# Patient Record
Sex: Female | Born: 1935
Health system: Southern US, Community
[De-identification: ages and names within clinical notes are randomized; demographics above are authoritative.]

## PROBLEM LIST (undated history)

## (undated) DIAGNOSIS — K219 Gastro-esophageal reflux disease without esophagitis: Secondary | ICD-10-CM

## (undated) DIAGNOSIS — I499 Cardiac arrhythmia, unspecified: Secondary | ICD-10-CM

## (undated) DIAGNOSIS — T7840XA Allergy, unspecified, initial encounter: Secondary | ICD-10-CM

## (undated) DIAGNOSIS — K589 Irritable bowel syndrome without diarrhea: Secondary | ICD-10-CM

## (undated) DIAGNOSIS — I10 Essential (primary) hypertension: Secondary | ICD-10-CM

## (undated) DIAGNOSIS — I00 Rheumatic fever without heart involvement: Secondary | ICD-10-CM

## (undated) DIAGNOSIS — E785 Hyperlipidemia, unspecified: Secondary | ICD-10-CM

## (undated) DIAGNOSIS — D4989 Neoplasm of unspecified behavior of other specified sites: Secondary | ICD-10-CM

## (undated) DIAGNOSIS — M1990 Unspecified osteoarthritis, unspecified site: Secondary | ICD-10-CM

## (undated) HISTORY — DX: Cardiac arrhythmia, unspecified: I49.9

## (undated) HISTORY — PX: TONSILLECTOMY: SUR1361

## (undated) HISTORY — PX: BOWEL RESECTION: SHX1257

## (undated) HISTORY — DX: Unspecified osteoarthritis, unspecified site: M19.90

## (undated) HISTORY — PX: CHOLECYSTECTOMY: SHX55

## (undated) HISTORY — PX: APPENDECTOMY: SHX54

## (undated) HISTORY — DX: Rheumatic fever without heart involvement: I00

## (undated) HISTORY — DX: Neoplasm of unspecified behavior of other specified sites: D49.89

## (undated) HISTORY — PX: HEMORROIDECTOMY: SUR656

## (undated) HISTORY — DX: Gastro-esophageal reflux disease without esophagitis: K21.9

## (undated) HISTORY — DX: Essential (primary) hypertension: I10

## (undated) HISTORY — DX: Irritable bowel syndrome, unspecified: K58.9

## (undated) HISTORY — DX: Hyperlipidemia, unspecified: E78.5

## (undated) HISTORY — PX: CATARACT EXTRACTION: SUR2

## (undated) HISTORY — PX: ROTATOR CUFF REPAIR: SHX139

## (undated) HISTORY — PX: ABDOMINAL HYSTERECTOMY: SHX81

## (undated) HISTORY — DX: Allergy, unspecified, initial encounter: T78.40XA

---

## 2003-12-22 ENCOUNTER — Encounter: Admission: RE | Admit: 2003-12-22 | Discharge: 2003-12-22 | Payer: Self-pay | Admitting: Family Medicine

## 2004-01-03 ENCOUNTER — Encounter: Admission: RE | Admit: 2004-01-03 | Discharge: 2004-01-03 | Payer: Self-pay | Admitting: Family Medicine

## 2004-01-11 ENCOUNTER — Encounter: Admission: RE | Admit: 2004-01-11 | Discharge: 2004-02-16 | Payer: Self-pay | Admitting: Sports Medicine

## 2005-11-05 ENCOUNTER — Ambulatory Visit: Payer: Self-pay | Admitting: Family Medicine

## 2005-11-08 ENCOUNTER — Ambulatory Visit: Payer: Self-pay | Admitting: Family Medicine

## 2005-12-03 ENCOUNTER — Ambulatory Visit: Payer: Self-pay | Admitting: Family Medicine

## 2006-01-10 ENCOUNTER — Ambulatory Visit: Payer: Self-pay | Admitting: Family Medicine

## 2006-04-18 ENCOUNTER — Ambulatory Visit: Payer: Self-pay | Admitting: Internal Medicine

## 2006-06-15 DIAGNOSIS — R7989 Other specified abnormal findings of blood chemistry: Secondary | ICD-10-CM | POA: Insufficient documentation

## 2006-12-08 ENCOUNTER — Telehealth (INDEPENDENT_AMBULATORY_CARE_PROVIDER_SITE_OTHER): Payer: Self-pay | Admitting: Family Medicine

## 2007-01-09 ENCOUNTER — Ambulatory Visit: Payer: Self-pay | Admitting: Family Medicine

## 2007-01-16 ENCOUNTER — Ambulatory Visit: Payer: Self-pay | Admitting: Family Medicine

## 2007-01-16 DIAGNOSIS — I8390 Asymptomatic varicose veins of unspecified lower extremity: Secondary | ICD-10-CM | POA: Insufficient documentation

## 2007-01-27 ENCOUNTER — Ambulatory Visit: Payer: Self-pay | Admitting: Family Medicine

## 2007-01-28 ENCOUNTER — Telehealth (INDEPENDENT_AMBULATORY_CARE_PROVIDER_SITE_OTHER): Payer: Self-pay | Admitting: *Deleted

## 2007-01-28 ENCOUNTER — Encounter (INDEPENDENT_AMBULATORY_CARE_PROVIDER_SITE_OTHER): Payer: Self-pay | Admitting: Family Medicine

## 2007-01-28 LAB — CONVERTED CEMR LAB: Hgb A1c MFr Bld: 5.4 % (ref 4.6–6.0)

## 2007-07-08 ENCOUNTER — Telehealth (INDEPENDENT_AMBULATORY_CARE_PROVIDER_SITE_OTHER): Payer: Self-pay | Admitting: *Deleted

## 2008-04-05 ENCOUNTER — Encounter (INDEPENDENT_AMBULATORY_CARE_PROVIDER_SITE_OTHER): Payer: Self-pay | Admitting: *Deleted

## 2008-04-05 ENCOUNTER — Encounter: Payer: Self-pay | Admitting: Family Medicine

## 2008-04-05 LAB — CONVERTED CEMR LAB
AST: 27 units/L
BUN: 12 mg/dL
Calcium: 9.9 mg/dL
Free T4: 0.97 ng/dL
HDL: 98 mg/dL
Magnesium: 1.8 mg/dL
TSH: 3.21 microintl units/mL
Triglycerides: 101 mg/dL

## 2008-10-05 ENCOUNTER — Encounter (INDEPENDENT_AMBULATORY_CARE_PROVIDER_SITE_OTHER): Payer: Self-pay | Admitting: *Deleted

## 2008-10-05 ENCOUNTER — Ambulatory Visit: Payer: Self-pay | Admitting: Family Medicine

## 2008-10-05 DIAGNOSIS — R109 Unspecified abdominal pain: Secondary | ICD-10-CM | POA: Insufficient documentation

## 2008-10-05 DIAGNOSIS — R519 Headache, unspecified: Secondary | ICD-10-CM | POA: Insufficient documentation

## 2008-10-05 DIAGNOSIS — E782 Mixed hyperlipidemia: Secondary | ICD-10-CM | POA: Insufficient documentation

## 2008-10-05 DIAGNOSIS — R51 Headache: Secondary | ICD-10-CM | POA: Insufficient documentation

## 2008-10-05 DIAGNOSIS — I10 Essential (primary) hypertension: Secondary | ICD-10-CM | POA: Insufficient documentation

## 2008-11-04 ENCOUNTER — Ambulatory Visit: Payer: Self-pay | Admitting: Internal Medicine

## 2008-11-04 DIAGNOSIS — K219 Gastro-esophageal reflux disease without esophagitis: Secondary | ICD-10-CM

## 2008-11-04 DIAGNOSIS — K5730 Diverticulosis of large intestine without perforation or abscess without bleeding: Secondary | ICD-10-CM | POA: Insufficient documentation

## 2008-12-08 ENCOUNTER — Ambulatory Visit: Payer: Self-pay | Admitting: Internal Medicine

## 2008-12-22 ENCOUNTER — Ambulatory Visit: Payer: Self-pay | Admitting: Internal Medicine

## 2010-09-14 NOTE — Assessment & Plan Note (Signed)
[facility]                          [place] OFFICE NOTE   INDYA, OLIVERIA                       MRN:          [id number]  DATE:01/10/2006                            DOB:          Aug 04, 1933    REASON FOR VISIT:  Muscle/body aches.   Ms. Zurita is a 77 year old female who is extremely active on a daily basis.  She reports that over the last 4-5 days she has been having discomfort in  her mid back on the right side.  It is pretty constant but it is exacerbated  with certain movement.  She describes similar discomfort in her right  lateral neck and upper arm.  She describes it as a muscle soreness.  She  continues to exercise regularly even though she has these symptoms.  She was  uncomfortable overnight and took some Tylenol.  She denies any other  symptoms, i.e., chest pain, shortness of breath, nausea, vomiting, fevers,  chills, cough, congestion.  Additionally, she denies any urinary symptoms.   MEDICATIONS:  1. Multivitamin.  2. Fish oil capsules.  3. Glucosamine.   OBJECTIVE:  VITAL SIGNS:  Blood pressure 120/86.  Weight 152.8.  Temperature  98.0.  Pulse 64.  GENERAL:  We have a pleasant female in no acute distress.  Answers questions  appropriately.  BACK:  Examination of the back significant for no midline tenderness.  Pain  is located on the paraspinal muscles on the right lower thoracic area.  No  obvious deformity was noted on palpation or on visual examination.  RIGHT SHOULDER:  Examination of the right shoulder significant for full  range of motion.  Strength is 5/5.  Cannot precipitate discomfort but the  patient was pain-free through the assessment of the right arm.  NEUROLOGIC:  Nonfocal.  ABDOMEN:  Soft, nondistended.  No palpable masses.  No hepatosplenomegaly.  No rebound or guarding.  UA is negative.   IMPRESSION:  This is a 77 year old female presenting with symptoms  consistent with musculoskeletal  strain.   PLAN:  1. Encouraged the patient to decrease exercise over the next 7-10 days.      Recommended daily stretching 2-3 times a day and walking daily as a      substitute for her exercise for the next 7-10 days.  2. Initially was going to prescribe over-the-counter Tylenol but the      patient did not find it very effective last night and therefore was      prescribed Naprosyn 500 mg b.i.d. for 10 days.  The patient is aware of      adverse GI effect as well as cardiovascular effects.  3. Recommended she take Prilosec OTC daily while on Naprosyn.  4. Advised the patient to follow up if symptoms worsen or do not improve      over the next 7-10 days.                                   Leanne Chang, M.D.   LA/MedQ  DD:  01/10/2006  DT:  01/11/2006  Job #:  [id number]

## 2011-01-16 ENCOUNTER — Ambulatory Visit (INDEPENDENT_AMBULATORY_CARE_PROVIDER_SITE_OTHER): Payer: Medicare Other | Admitting: Family Medicine

## 2011-01-16 ENCOUNTER — Encounter: Payer: Self-pay | Admitting: Family Medicine

## 2011-01-16 DIAGNOSIS — T7840XA Allergy, unspecified, initial encounter: Secondary | ICD-10-CM | POA: Insufficient documentation

## 2011-01-16 NOTE — Progress Notes (Signed)
  Subjective:    Patient ID: Jennifer Boone, female    DOB: [birth year], 77 y.o.   MRN: [id number]  HPI Allergic rxn- had a reaction on face and neck yesterday, 'was scarlet'.  Took benadryl yesterday w/ good results.  Pt denies changing soaps, lotions, detergents, makeup.  Does use towels at gym to wipe sweat from face.   Review of Systems For ROS see HPI     Objective:   Physical Exam  Vitals reviewed. Constitutional: She appears well-developed and well-nourished.  HENT:  Head: Normocephalic and atraumatic.  Skin: Skin is warm and dry. No rash noted. No erythema.          Assessment & Plan:

## 2011-01-16 NOTE — Patient Instructions (Signed)
Your reaction may have been due to the gym towels You look great today! Call with any questions or concerns Have a safe trip back to [place]

## 2011-01-16 NOTE — Assessment & Plan Note (Signed)
Pt's reaction has completely resolved.  No need for oral or topical meds.  Pt to ask her gym if detergent was changed.  Reviewed supportive care and red flags that should prompt return.  Pt expressed understanding and is in agreement w/ plan.

## 2011-01-24 ENCOUNTER — Ambulatory Visit (INDEPENDENT_AMBULATORY_CARE_PROVIDER_SITE_OTHER): Payer: Medicare Other | Admitting: Family Medicine

## 2011-01-24 ENCOUNTER — Encounter: Payer: Self-pay | Admitting: Family Medicine

## 2011-01-24 VITALS — BP 124/84 | Temp 98.7°F | Wt 158.0 lb

## 2011-01-24 DIAGNOSIS — T7840XA Allergy, unspecified, initial encounter: Secondary | ICD-10-CM

## 2011-01-24 MED ORDER — PREDNISONE 20 MG PO TABS: ORAL_TABLET | ORAL | Status: DC

## 2011-01-24 MED ORDER — METHYLPREDNISOLONE ACETATE 80 MG/ML IJ SUSP
80.0000 mg | Freq: Once | INTRAMUSCULAR | Status: AC
  Administered 2011-01-24: 80 mg via INTRAMUSCULAR

## 2011-01-24 NOTE — Patient Instructions (Signed)
This is due to your seasonal allergies Start Zyrtec daily Take the Prednisone starting tomorrow- take w/ food to avoid upset stomach Call with any questions or concerns Hang in there!

## 2011-01-24 NOTE — Progress Notes (Signed)
  Subjective:    Patient ID: Jennifer Boone, female    DOB: [birth year], 77 y.o.   MRN: [id number]  HPI Allergies- reports she again broke out on face, neck.  Having itchy scalp.  This morning had puffy eyes.  Took Benadryl yesterday which made her sleepy.  Took Entex w/ similar results.  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  Husband reports this is a seasonal rxn that occurs 'nearly every spring and fall'.  Pt feels it's worse in fall.  Not taking any allergy meds.   Review of Systems For ROS see HPI     Objective:   Physical Exam  Vitals reviewed. Constitutional: She appears well-developed and well-nourished. No distress.  HENT:       No swelling of lips, tongue, pharynx  Skin: Skin is warm and dry. There is erythema (redness and swelling of face, neck, and hairline).          Assessment & Plan:

## 2011-01-24 NOTE — Assessment & Plan Note (Signed)
Pt having seasonal allergic response to environmental exposure.  Reviewed importance of daily antihistamine use to prevent such a reaction.  Given facial involvement will give depo-medrol in office and start steroid burst tomorrow.  Reviewed supportive care and red flags that should prompt return.  Pt expressed understanding and is in agreement w/ plan.

## 2011-02-04 ENCOUNTER — Encounter: Payer: Self-pay | Admitting: Family Medicine

## 2011-02-04 ENCOUNTER — Ambulatory Visit (INDEPENDENT_AMBULATORY_CARE_PROVIDER_SITE_OTHER): Payer: Medicare Other | Admitting: Family Medicine

## 2011-02-04 VITALS — BP 132/80 | Temp 97.7°F | Wt 160.0 lb

## 2011-02-04 DIAGNOSIS — T7840XA Allergy, unspecified, initial encounter: Secondary | ICD-10-CM

## 2011-02-04 MED ORDER — PREDNISONE 20 MG PO TABS: ORAL_TABLET | ORAL | Status: DC

## 2011-02-04 NOTE — Assessment & Plan Note (Signed)
Unclear as to what is responsible for pt's repeated rxns but she does not have trouble w/ this in [place].  Recommended allergist appt- pt reports she is leaving for [place] in 2 weeks and is working [employer] until then.  Will start steroids and pt to f/u if sxs persist in [place].  Pt expressed understanding and is in agreement w/ plan.

## 2011-02-04 NOTE — Progress Notes (Signed)
  Subjective:    Patient ID: Jennifer Boone, female    DOB: 06-09-1933, 77 y.o.   MRN: [id number]  HPI Allergic rxn- has been treated twice in last month for similar.  Did well on Prednisone and added Zyrtec after last visit.  Was doing well until Saturday when she again broke out on neck.  This AM had swollen, itchy R eye- swelling has improved.  Also has redness and itching of R scalp and hairline.   Review of Systems For ROS see HPI     Objective:   Physical Exam  Vitals reviewed. Constitutional: She appears well-developed and well-nourished.  Skin: Skin is warm and dry. There is erythema (circumferentially around neck from clavicles to chin, also along R hairline and scalp).          Assessment & Plan:

## 2011-02-04 NOTE — Patient Instructions (Signed)
Take the Prednisone for the next 10 days- take w/ food to avoid upset stomach Continue the Zyrtec daily- add Benadryl at night as needed for itching and sleep If you continue to have difficulty once in [facility]- see an allergist Call with any questions or concerns Hang in there!!!

## 2011-02-07 ENCOUNTER — Telehealth: Payer: Self-pay | Admitting: Family Medicine

## 2011-02-07 NOTE — Telephone Encounter (Signed)
If the medicine is causing her reaction she can stop this- i would recommend a 1-2 week 'washout' period prior to starting a new medicine so that there is no confusion if she has a reaction to the new med.  Based on this time frame, she will be in [facility].  It is ok for her BP to run slightly high for this short period of time but recommend she set up an appt w/ her doctor in [facility] for as soon as she gets back.

## 2011-02-07 NOTE — Telephone Encounter (Signed)
Pt aware and verbalized understanding.  

## 2011-02-07 NOTE — Telephone Encounter (Signed)
The pt called and stated her current blood pressure medicine is causing her to have a rash and itchyness and is hoping you can switch it to a different med.

## 2011-02-15 ENCOUNTER — Telehealth: Payer: Self-pay | Admitting: *Deleted

## 2011-02-15 DIAGNOSIS — T7840XA Allergy, unspecified, initial encounter: Secondary | ICD-10-CM

## 2011-02-15 MED ORDER — PREDNISONE 20 MG PO TABS: ORAL_TABLET | ORAL | Status: DC

## 2011-02-15 NOTE — Telephone Encounter (Signed)
Pt left VM that she would like to get a refill on the prednisone to carry her over to her trip to [place]. Pt indicated that if no better upon arrival she will f/u with physician there. Pt notes that every time she comes off prednisone symptoms returns..Please advise

## 2011-02-15 NOTE — Telephone Encounter (Signed)
Discuss with patient, Rx sent to pharmacy. 

## 2011-02-15 NOTE — Telephone Encounter (Signed)
Ok for refill.  If pt thought this was in response to her BP med and she stopped BP med this should no longer be an issue.

## 2011-02-15 NOTE — Telephone Encounter (Signed)
Left message to call office

## 2013-10-16 ENCOUNTER — Ambulatory Visit (INDEPENDENT_AMBULATORY_CARE_PROVIDER_SITE_OTHER): Payer: Medicare Other | Admitting: Family Medicine

## 2013-10-16 ENCOUNTER — Encounter: Payer: Self-pay | Admitting: Family Medicine

## 2013-10-16 VITALS — BP 138/84 | HR 62 | Temp 98.6°F | Wt 153.0 lb

## 2013-10-16 DIAGNOSIS — R51 Headache: Secondary | ICD-10-CM

## 2013-10-16 MED ORDER — PREDNISONE 20 MG PO TABS: 40.0000 mg | ORAL_TABLET | Freq: Every day | ORAL | Status: DC

## 2013-10-16 NOTE — Progress Notes (Signed)
SUBJECTIVE: Jennifer Boone is a 80 y.o. female who complains of headaches for 5 day duration. 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 Patient has been seen by a specialist previously. Patient was unable to get an appointment until the end of July. Patient states that over the course last 2 months she's been having worsening headaches for quite some time but was significantly worse over the last 5 days. Patient to do wake up this morning states that it is feeling better. Patient states that it was incapacitating causing her to lay down in bed. Patient states that she did not have any visual changes and no weakness or numbness in any of the extremities. Patient denies any recent injuries. Patient states though that this feels very similar to when she was having the pain previously from the pseudotumor which did respond to prednisone previously. No other symptoms out of the ordinary. No history of any fevers or chills or any abnormal weight loss.}.    OBJECTIVE: Blood pressure 138/84, pulse 62, temperature 98.6 F (37 C), temperature source Oral, weight 153 lb (69.4 kg), SpO2 97.00%.  Appearance: alert, well appearing, and in no distress. Neurological Exam: alert, oriented, normal speech, no focal findings or movement disorder noted, screening mental status exam normal, neck supple without rigidity, cranial nerves II through XII intact, funduscopic exam normal, discs flat and sharp, DTR's normal and symmetric, Babinski sign negative, motor and sensory grossly normal bilaterally, normal muscle tone, no tremors, strength 5/5, Romberg sign negative, normal gait and station. Tympanic membranes normal bilaterally. No cervical lymphadenopathy noted. No rash noted on the face.  ASSESSMENT: Headache ? From recurrent pseudo tumor. Marland Kitchen  PLAN: Recommendations: side effect profile discussed in detail, asked to keep headache diary and patient  reassured that neurodiagnostic workup not indicated from benign H & P. See orders for this visit as documented in the electronic medical record. Because the patient's past medical history we will do another dose of prednisone. Discuss in great detail about side effects or any signs of any cervical vascular accidents and went to seek medical attention. Patient will attempt to followup with primary care Jennifer Boone we'll also try to follow up a specialist.  Spent greater than 25 minutes with patient face-to-face and had greater than 50% of counseling including as described above in assessment and plan.

## 2013-10-16 NOTE — Patient Instructions (Addendum)
Very nice to meet you.  Your eye I do not see any new swelling on exam which is good.  We will try prednisone again.  Take daily for next week.  If any numbness, weakness or visual change please seek medical attention immediately.  You can add tylenol 325mg  three times daily if you need as well.  I would call your specialist and see if you could get in sooner as well.   General Headache Without Cause A headache is pain or discomfort felt around the head or neck area. The specific cause of a headache may not be found. There are many causes and types of headaches. A few common ones are:  Tension headaches.  Migraine headaches.  Cluster headaches.  Chronic daily headaches. HOME CARE INSTRUCTIONS   Keep all follow-up appointments with your caregiver or any specialist referral.  Only take over-the-counter or prescription medicines for pain or discomfort as directed by your caregiver.  Lie down in a dark, quiet room when you have a headache.  Keep a headache journal to find out what may trigger your migraine headaches. For example, write down:  What you eat and drink.  How much sleep you get.  Any change to your diet or medicines.  Try massage or other relaxation techniques.  Put ice packs or heat on the head and neck. Use these 3 to 4 times per day for 15 to 20 minutes each time, or as needed.  Limit stress.  Sit up straight, and do not tense your muscles.  Quit smoking if you smoke.  Limit alcohol use.  Decrease the amount of caffeine you drink, or stop drinking caffeine.  Eat and sleep on a regular schedule.  Get 7 to 9 hours of sleep, or as recommended by your caregiver.  Keep lights dim if bright lights bother you and make your headaches worse. SEEK MEDICAL CARE IF:   You have problems with the medicines you were prescribed.  Your medicines are not working.  You have a change from the usual headache.  You have nausea or vomiting. SEEK IMMEDIATE MEDICAL CARE  IF:   Your headache becomes severe.  You have a fever.  You have a stiff neck.  You have loss of vision.  You have muscular weakness or loss of muscle control.  You start losing your balance or have trouble walking.  You feel faint or pass out.  You have severe symptoms that are different from your first symptoms. MAKE SURE YOU:   Understand these instructions.  Will watch your condition.  Will get help right away if you are not doing well or get worse. Document Released: 04/15/2005 Document Revised: 07/08/2011 Document Reviewed: 05/01/2011 [facility] Patient Information 2015 [place], [place]. This information is not intended to replace advice given to you by your health care provider. Make sure you discuss any questions you have with your health care provider.

## 2013-11-19 ENCOUNTER — Other Ambulatory Visit: Payer: Self-pay | Admitting: Family Medicine

## 2013-11-19 MED ORDER — FLUTICASONE PROPIONATE 50 MCG/ACT NA SUSP: 1.0000 | Freq: Every day | NASAL | Status: DC

## 2013-11-25 ENCOUNTER — Encounter: Payer: Self-pay | Admitting: Internal Medicine

## 2013-11-25 LAB — HM DIABETES EYE EXAM

## 2013-11-26 ENCOUNTER — Encounter: Payer: Self-pay | Admitting: General Practice

## 2013-12-02 ENCOUNTER — Encounter: Payer: Self-pay | Admitting: Family Medicine

## 2013-12-02 ENCOUNTER — Ambulatory Visit (INDEPENDENT_AMBULATORY_CARE_PROVIDER_SITE_OTHER): Payer: Medicare Other | Admitting: Family Medicine

## 2013-12-02 VITALS — BP 120/84 | HR 72 | Temp 98.2°F | Resp 16 | Wt 155.2 lb

## 2013-12-02 DIAGNOSIS — R51 Headache: Secondary | ICD-10-CM

## 2013-12-02 DIAGNOSIS — H8110 Benign paroxysmal vertigo, unspecified ear: Secondary | ICD-10-CM

## 2013-12-02 MED ORDER — SUMATRIPTAN SUCCINATE 50 MG PO TABS: 50.0000 mg | ORAL_TABLET | Freq: Once | ORAL | Status: DC

## 2013-12-02 MED ORDER — MECLIZINE HCL 32 MG PO TABS: 32.0000 mg | ORAL_TABLET | Freq: Three times a day (TID) | ORAL | Status: DC | PRN

## 2013-12-02 NOTE — Progress Notes (Signed)
   Subjective:    Patient ID: Jennifer Boone, female    DOB: [birth year], 80 y.o.   MRN: [id number]  HPI Occipital HA- R sided, radiating to front.  'it's one of those ones where you just want to hold your head and die'.  Last HA 7/28.  Occuring approximately q10 days. Had 6 months sx free and then HAs returned.   HAs will stop after 5-7 days of prednisone.  Saw ophtho at [facility] last week, had normal blood work including CBC, CRP and ESR.  No imaging performed.  Pt has hx of ocular mass (had shown improvement on MRI done 6/14 and thought to be pseudo-tumor).  Ophtho did not feel that this was contributing to HAs.  Pt has had HAs for ~1 yr.  Pt saw Neuro in [facility] and initial dx was myasthenia gravis due to drooping R eye but this was incorrect.  Neuro never addressed HA.  No nausea associated w/ HAs.  + photophobia, no phonophobia.  Now having dizziness w rolling over in bed and various movements.   Review of Systems For ROS see HPI     Objective:   Physical Exam  Vitals reviewed. Constitutional: She is oriented to person, place, and time. She appears well-developed and well-nourished. No distress.  HENT:  Head: Normocephalic and atraumatic.  Mouth/Throat: Uvula is midline and mucous membranes are normal.  TMs WNL No TTP over sinuses Minimal nasal congestion  Eyes: Conjunctivae and EOM are normal. Pupils are equal, round, and reactive to light.  2-3 beats of horizontal nystagmus  Neck: Normal range of motion. Neck supple.  Cardiovascular: Normal rate, regular rhythm, normal heart sounds and intact distal pulses.   Pulmonary/Chest: Effort normal and breath sounds normal. No respiratory distress. She has no wheezes. She has no rales.  Musculoskeletal: She exhibits no edema.  Lymphadenopathy:    She has no cervical adenopathy.  Neurological: She is alert and oriented to person, place, and time. She has normal reflexes. No cranial nerve deficit.  Skin: Skin is warm and dry.  Psychiatric: She has a  normal mood and affect. Her behavior is normal. Judgment and thought content normal.          Assessment & Plan:  b

## 2013-12-02 NOTE — Progress Notes (Signed)
Pre visit review using our clinic review tool, if applicable. No additional management support is needed unless otherwise documented below in the visit note. 

## 2013-12-02 NOTE — Patient Instructions (Signed)
We'll call you with your Neurology appt Next time you feel a headache starting, take the Sumatriptan Use the Meclizine as needed for dizziness Drink plenty of fluids Do the exercises as directed Call with any questions or concerns Hang in there!

## 2013-12-05 NOTE — Assessment & Plan Note (Signed)
New.  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  Start Meclizine prn and pt to do Modified [place] Maneuver to desensitize her inner ear.  Reviewed supportive care and red flags that should prompt return.  Pt expressed understanding and is in agreement w/ plan.

## 2013-12-05 NOTE — Assessment & Plan Note (Signed)
New.  Pt's episodic, unilateral HAs w/ associated photophobia could be migraine variant.  Will give script for abortive triptan and refer to neuro for complete evaluation as pt has never had neuro assess these HAs.  Pt expressed understanding and is in agreement w/ plan.

## 2014-01-13 ENCOUNTER — Telehealth: Payer: Self-pay | Admitting: *Deleted

## 2014-01-13 NOTE — Telephone Encounter (Signed)
Received prior authorization form Sumatriptan 50mg  via fax from [place].  Form filled out on [email or web address].  Awaiting approval.//AB/CMA

## 2014-01-14 ENCOUNTER — Other Ambulatory Visit: Payer: Self-pay | Admitting: *Deleted

## 2014-01-14 ENCOUNTER — Telehealth: Payer: Self-pay | Admitting: Neurology

## 2014-01-14 ENCOUNTER — Ambulatory Visit (INDEPENDENT_AMBULATORY_CARE_PROVIDER_SITE_OTHER): Payer: Medicare Other | Admitting: Neurology

## 2014-01-14 ENCOUNTER — Encounter: Payer: Self-pay | Admitting: Neurology

## 2014-01-14 VITALS — BP 128/68 | HR 68 | Resp 16 | Ht 64.0 in | Wt 151.9 lb

## 2014-01-14 DIAGNOSIS — H05111 Granuloma of right orbit: Secondary | ICD-10-CM

## 2014-01-14 DIAGNOSIS — R519 Headache, unspecified: Secondary | ICD-10-CM

## 2014-01-14 DIAGNOSIS — H05119 Granuloma of unspecified orbit: Secondary | ICD-10-CM | POA: Insufficient documentation

## 2014-01-14 DIAGNOSIS — R51 Headache: Secondary | ICD-10-CM

## 2014-01-14 LAB — BUN: BUN: 14 mg/dL (ref 6–23)

## 2014-01-14 LAB — CREATININE, SERUM: CREATININE: 0.71 mg/dL (ref 0.50–1.10)

## 2014-01-14 NOTE — Patient Instructions (Addendum)
1.  I want to get an MRI of the brain to look for cause of headache if there is a finding we will proceed with the [facility] 01/28/14 8:45 am  2.  Let's meet back after the MRI.  We can discuss treatment options at that time.  If you get a flareup, we can try indomethacin.  Call if the headache recurs in the interim.

## 2014-01-14 NOTE — Progress Notes (Signed)
NEUROLOGY CONSULTATION NOTE  Jennifer Boone MRN: [id number] DOB: Feb 13, 1934  Referring provider: Dr. Birdie Riddle Primary care provider: Dr. Birdie Riddle  Reason for consult:  headache  HISTORY OF PRESENT ILLNESS: Jennifer Boone is a 80 year old right-handed woman with history of hypertension, hyperlipidemia, hyperglycemia, and orbital pseudotumor who presents for headache and vertigo.  Records reviewed.  She is accompanied by her husband.  On 07/12/12, she woke up with mild swelling, redness and ptosis of the right eye.  There was no vision loss or double vision.  She was evaluated by ophthalmology.  On 07/14/12, she reportedly had carotid duplex and MRI of the brain, which were normal.  Her ptosis improved while on a prednisone taper, but returned once she finished it.  She saw a neurologist, Dr. Orvil Feil, in April 2014.  She was given a trial of Mestinon.  The ptosis did improve but the Mestinon was discontinued due to diarrhea and intense headache.  MRI of the orbits was performed on 08/20/12 and revealed a mass behind the right orbit.  She was referred to Dr. Rosendo Gros, ophthalmologist at [facility].  Repeat MRI of the brain and orbits with and without contrast was performed on 10/03/12, which revealed interval diminished enlargement and enhancement of the right superior rectus and levator palpebral superioris muscle and right superior oblique muscle, as well as diminished enhancement along the adjacent dura of the anterior cranial fossa on the right.  She did not have any further problems until June 2015.  She developed a headache.  It involves the right side of her occipital region, sometimes involving the right frontal region.  It is a constant squeezing pain, 9/10 intensity.  There is no neck pain.  There are no associated symptoms, such as nausea, vomiting, photophobia, phonophobia or autonomic symptoms.  There is no recurrence of right eye ptosis or eye pain or visual disturbance.  They seem to occur in  cycles, with periods lasting 5 to 7 days straight, occurring once a month.  She had another round of prednisone, which did not help.  Extra-strength Tylenol was minimally effective.  She tried an Imitrex once, which helped.  She most recently followed up with Dr. Barb Merino, ophthalmology at [facility] in July.  Her eye exam was unremarkable.  Blood work performed 11/16/13 revealed Sed Rate 5, CRP 0.5, WBC 7.4, HGB 13.7, HCT 42.6, and PLT 303.  She has no prior history of headache.  PAST MEDICAL HISTORY: Past Medical History  Diagnosis Date  . Hyperlipidemia   . Hypertension   . Arthritis   . IBS (irritable bowel syndrome)   . Arrhythmia   . 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 Appendectomy    . Cholecystectomy    . Bowel resection    . Hemorroidectomy    . Rotator cuff repair    . Abdominal hysterectomy    . Tonsillectomy    . Cataract extraction      MEDICATIONS: Current Outpatient Prescriptions on File Prior to Visit  Medication Sig Dispense Refill  . aspirin 81 MG tablet Take 81 mg by mouth daily.        . fish oil-omega-3 fatty acids 1000 MG capsule Take 1 g by mouth daily.        . fluticasone (FLONASE) 50 MCG/ACT nasal spray Place 1 spray into both nostrils daily.  16 g  6  . glucosamine-chondroitin 500-400 MG  tablet Take 1 tablet by mouth 3 (three) times daily.        . meclizine (ANTIVERT) 32 MG tablet Take 1 tablet (32 mg total) by mouth 3 (three) times daily as needed.  30 tablet  0  . Multiple Vitamin (MULTIVITAMIN) tablet Take 1 tablet by mouth daily.        Marland Kitchen omeprazole (PRILOSEC) 20 MG capsule Take 20 mg by mouth daily.        . simvastatin (ZOCOR) 40 MG tablet Take 40 mg by mouth at bedtime.        . SUMAtriptan (IMITREX) 50 MG tablet Take 1 tablet (50 mg total) by mouth once. May repeat in 2 hours if headache persists or recurs.  10 tablet  3  . amLODipine (NORVASC) 5 MG tablet  Take 5 mg by mouth daily.        . calcium carbonate (OS-CAL) 600 MG TABS Take 600 mg by mouth daily.         No current facility-administered medications on file prior to visit.    ALLERGIES: Allergies  Allergen Reactions  . Celebrex [Celecoxib]     hives  . Amoxicillin     REACTION: hives  . Cefuroxime Axetil     REACTION: throat closes!!!!!  . Clindamycin     REACTION: hives  . Tetracycline     REACTION: GI upset    FAMILY HISTORY: Family History  Problem Relation Age of Onset  . Heart disease Mother   . Diabetes Sister   . Heart disease Sister     SOCIAL HISTORY: History   Social History  . Marital Status: Married    Spouse Name: N/A    Number of Children: N/A  . Years of Education: N/A   Occupational History  . Not on file.   Social History Main Topics  . Smoking status: Former Research scientist (life sciences)  . Smokeless tobacco: Not on file  . Alcohol Use: No  . Drug Use: No  . Sexual Activity: No   Other Topics Concern  . Not on file   Social History Narrative  . No narrative on file    REVIEW OF SYSTEMS: Constitutional: No fevers, chills, or sweats, no generalized fatigue, change in appetite Eyes: No visual changes, double vision, eye pain Ear, nose and throat: No hearing loss, ear pain, nasal congestion, sore throat Cardiovascular: No chest pain, palpitations Respiratory:  No shortness of breath at rest or with exertion, wheezes GastrointestinaI: No nausea, vomiting, diarrhea, abdominal pain, fecal incontinence Genitourinary:  No dysuria, urinary retention or frequency Musculoskeletal:  No neck pain, back pain Integumentary: No rash, pruritus, skin lesions Neurological: as above Psychiatric: No depression, insomnia, anxiety Endocrine: No palpitations, fatigue, diaphoresis, mood swings, change in appetite, change in weight, increased thirst Hematologic/Lymphatic:  No anemia, purpura, petechiae. Allergic/Immunologic: no itchy/runny eyes, nasal congestion, recent  allergic reactions, rashes  PHYSICAL EXAM: Filed Vitals:   01/14/14 0918  BP: 128/68  Pulse: 68  Resp: 16   General: No acute distress Head:  Normocephalic/atraumatic Neck: supple, no paraspinal tenderness, full range of motion Back: No paraspinal tenderness Heart: regular rate and rhythm Lungs: Clear to auscultation bilaterally. Vascular: No carotid bruits. Neurological Exam: Mental status: alert and oriented to person, place, and time, recent and remote memory intact, fund of knowledge intact, attention and concentration intact, speech fluent and not dysarthric, language intact. Cranial nerves: CN I: not tested CN II: pupils equal, round and reactive to light, visual fields intact, fundi unremarkable, without vessel changes,  exudates, hemorrhages or papilledema. CN III, IV, VI:  full range of motion, no nystagmus, no ptosis CN V: facial sensation intact CN VII: upper and lower face symmetric CN VIII: hearing intact CN IX, X: gag intact, uvula midline CN XI: sternocleidomastoid and trapezius muscles intact CN XII: tongue midline Bulk & Tone: normal, no fasciculations. Motor: 5/5 throughout Sensation: temperature and vibration intact. Deep Tendon Reflexes: 2+ throughout, toes downgoing Finger to nose testing: no dysmetria Heel to shin: no dysmetria Gait: normal station and stride.  Able to turn and walk in tandem. Romberg negative.  IMPRESSION: 1.  Right unilateral headache.  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 2.  Orbital pseudotumor  PLAN: 1.  As this is a new headache, we will get MRI of the brain with and without contrast. 2.  For treatment of another episode, will likely prescribe trial of indomethacin.  I would start 25mg  three times daily for 3 days, if no relief then 50mg  three times daily and then if no relief 75mg  three times a day for 3 days.   3.  Follow up after MRI to discuss management.  If MRI  unremarkable, would likely treat with indomethacin. 4.  I would not use the Imitrex at this time, particularly due to age.  45 minutes spent with patient, over 50% spent discussing possible diagnoses, workup and probable treatment.  Thank you for allowing me to take part in the care of this patient.  Metta Clines, DO  CC:  Annye Asa, MD

## 2014-01-14 NOTE — Telephone Encounter (Signed)
Pt needs to talk to someone about correct medication please call [phone]

## 2014-01-26 NOTE — Telephone Encounter (Addendum)
Received denial letter for prior authorization for Sumatriptan Succ 50mg .  Will inform the pt of the denial.//AB/CMA  Spoke with the pt and informed her that the prior authorization for the Sumatriptan Succ 50mg  was denied.  Informed pt that she will need to check with her insurance and see what they will pay for.  Pt stated that she is to have a MRI done which is ordered by Dr. Tomi Likens [facility]).  Pt stated that she will inform Dr. Tomi Likens that the Sumatriptan Succ was denied and see what his recommendation will be.  Verbally inform Dr. Birdie Riddle of the above note.  Dr. Birdie Riddle stated that she read Dr. Tomi Likens OV note and he noted that he will prescribe trial of Indomethacin,but will not use the Imitrex.//AB/CMA

## 2014-01-26 NOTE — Telephone Encounter (Signed)
LMOM @ (7:44am @ [phone]) asking the pt to RTC regarding prior authorization for Sumatriptan 50mg .//AB/CMA

## 2014-01-28 ENCOUNTER — Ambulatory Visit (HOSPITAL_COMMUNITY)
Admission: RE | Admit: 2014-01-28 | Discharge: 2014-01-28 | Disposition: A | Discharge status: Home / Self Care | Payer: Medicare Other | Source: Ambulatory Visit | Attending: Neurology | Admitting: Neurology

## 2014-01-28 DIAGNOSIS — R51 Headache: Secondary | ICD-10-CM | POA: Insufficient documentation

## 2014-01-28 DIAGNOSIS — R519 Headache, unspecified: Secondary | ICD-10-CM

## 2014-01-28 MED ORDER — GADOBENATE DIMEGLUMINE 529 MG/ML IV SOLN
15.0000 mL | Freq: Once | INTRAVENOUS | Status: AC | PRN
Start: 2014-01-28 — End: 2014-01-28
  Administered 2014-01-28: 15 mL via INTRAVENOUS

## 2014-01-31 ENCOUNTER — Telehealth: Payer: Self-pay | Admitting: *Deleted

## 2014-01-31 NOTE — Telephone Encounter (Signed)
Message copied by Claudie Revering on Mon Jan 31, 2014 10:07 AM ------      Message from: JAFFE, ADAM R      Created: Fri Jan 28, 2014  4:17 PM       MRI is unremarkable except for expected age-related changes.  Will discuss further at next visit.      ----- Message -----         From: Rad Results In Interface         Sent: 01/28/2014   3:18 PM           To: Dudley Major, DO                   ------

## 2014-01-31 NOTE — Telephone Encounter (Signed)
Patient is aware of MRI results

## 2014-02-04 ENCOUNTER — Telehealth: Payer: Self-pay | Admitting: Neurology

## 2014-02-04 ENCOUNTER — Encounter: Payer: Self-pay | Admitting: Neurology

## 2014-02-04 ENCOUNTER — Other Ambulatory Visit: Payer: Self-pay | Admitting: Neurology

## 2014-02-04 ENCOUNTER — Ambulatory Visit (INDEPENDENT_AMBULATORY_CARE_PROVIDER_SITE_OTHER): Payer: Medicare Other | Admitting: Neurology

## 2014-02-04 VITALS — BP 116/66 | HR 68 | Ht 65.0 in | Wt 152.0 lb

## 2014-02-04 DIAGNOSIS — R519 Headache, unspecified: Secondary | ICD-10-CM

## 2014-02-04 DIAGNOSIS — R51 Headache: Secondary | ICD-10-CM

## 2014-02-04 MED ORDER — SERTRALINE HCL 25 MG PO TABS
25.0000 mg | ORAL_TABLET | Freq: Every day | ORAL | Status: DC
Start: 2014-02-04 — End: 2014-02-04

## 2014-02-04 NOTE — Telephone Encounter (Signed)
Ok to fill 90 day per Dr Tomi Likens.

## 2014-02-04 NOTE — Telephone Encounter (Signed)
We can't try the indomethacin unless she has another headache.  I would like to start her on a medication that she can take daily with the hope of preventing them from happening, the sertraline.

## 2014-02-04 NOTE — Patient Instructions (Signed)
1.  We will start with sertraline 25mg  daily.  It is an antidepressant, which is also used for headache.  Side effects may include sleepiness, dizziness or dry mouth.  Call in 4 weeks with update and we can adjust dose if needed. 2.  If headache returns, we can try a trial of indomethacin 3.  Stop the imitrex 4.  Follow up in December.

## 2014-02-04 NOTE — Telephone Encounter (Signed)
Pt called requesting to speak to a nurse. She states that she rather try the antiinflammatory meds instead sertraline. C/B [phone]

## 2014-02-04 NOTE — Telephone Encounter (Signed)
Not sure that these are supposed to be interchangable. Please advise.

## 2014-02-04 NOTE — Progress Notes (Signed)
NEUROLOGY FOLLOW UP OFFICE NOTE  Jennifer Boone [id number]  HISTORY OF PRESENT ILLNESS: Jennifer Boone is a 80 year old right-handed woman with history of hypertension, hyperlipidemia, hyperglycemia, and orbital pseudotumor who follows up for right unilateral headache.  MRI personally reviewed.  UPDATE: MRI of the brain with and without contrast was performed on 01/28/14, which revealed generalized cerebral and cerebellar atrophy and small vessel ischemic changes, but no acute abnormalities or abnormal enhancement of the meninges, orbit or extraocular muscles.  She had another episode of headache lasting from October 1 to 8th.  It has resolved today.  She took Imitrex, which did not help.  HISTORY: On 07/12/12, she woke up with mild swelling, redness and ptosis of the right eye.  There was no vision loss or double vision.  She was evaluated by ophthalmology.  On 07/14/12, she reportedly had carotid duplex and MRI of the brain, which were normal.  Her ptosis improved while on a prednisone taper, but returned once she finished it.  She saw a neurologist, Dr. Orvil Feil, in April 2014.  She was given a trial of Mestinon.  The ptosis did improve but the Mestinon was discontinued due to diarrhea and intense headache.  MRI of the orbits was performed on 08/20/12 and revealed a mass behind the right orbit.  She was referred to Dr. Rosendo Gros, ophthalmologist at [facility].  Repeat MRI of the brain and orbits with and without contrast was performed on 10/03/12, which revealed interval diminished enlargement and enhancement of the right superior rectus and levator palpebral superioris muscle and right superior oblique muscle, as well as diminished enhancement along the adjacent dura of the anterior cranial fossa on the right.  She did not have any further problems until June 2015.  She developed a headache.  It involves the right side of her occipital region, sometimes involving the right frontal region.  It is a constant  squeezing pain, 9/10 intensity.  There is no neck pain.  There are no associated symptoms, such as nausea, vomiting, photophobia, phonophobia or autonomic symptoms.  There is no recurrence of right eye ptosis or eye pain or visual disturbance.  They seem to occur in cycles, with periods lasting 5 to 7 days straight, occurring once a month.  She had another round of prednisone, which did not help.  Extra-strength Tylenol was minimally effective.  She tried an Imitrex once, which helped.  She most recently followed up with Dr. Barb Merino, ophthalmology at [facility] in July.  Her eye exam was unremarkable.  Blood work performed 11/16/13 revealed Sed Rate 5, CRP 0.5, WBC 7.4, HGB 13.7, HCT 42.6, and PLT 303.  She has no prior history of headache.  PAST MEDICAL HISTORY: Past Medical History  Diagnosis Date  . Hyperlipidemia   . Hypertension   . Arthritis   . IBS (irritable bowel syndrome)   . Arrhythmia   . 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 aspirin 81 MG tablet Take 81 mg by mouth daily.        . fish oil-omega-3 fatty acids 1000 MG capsule Take 1 g by mouth daily.        . fluticasone (FLONASE) 50 MCG/ACT nasal spray Place 1 spray into both nostrils daily.  16 g  6  . glucosamine-chondroitin 500-400 MG tablet Take 1 tablet by mouth 3 (three) times daily.        Marland Kitchen  Multiple Vitamin (MULTIVITAMIN) tablet Take 1 tablet by mouth daily.        Marland Kitchen omeprazole (PRILOSEC) 20 MG capsule Take 20 mg by mouth daily.        . simvastatin (ZOCOR) 40 MG tablet Take 40 mg by mouth at bedtime.        . SUMAtriptan (IMITREX) 50 MG tablet Take 1 tablet (50 mg total) by mouth once. May repeat in 2 hours if headache persists or recurs.  10 tablet  3   No current facility-administered medications on file prior to visit.    ALLERGIES: Allergies  Allergen Reactions  . Celebrex [Celecoxib]     hives  .  Amoxicillin     REACTION: hives  . Cefuroxime Axetil     REACTION: throat closes!!!!!  . Clindamycin     REACTION: hives  . Tetracycline     REACTION: GI upset    FAMILY HISTORY: Family History  Problem Relation Age of Onset  . Heart disease Mother   . Diabetes Sister   . Heart disease Sister     SOCIAL HISTORY: History   Social History  . Marital Status: Married    Spouse Name: N/A    Number of Children: N/A  . Years of Education: N/A   Occupational History  . Not on file.   Social History Main Topics  . Smoking status: Former Research scientist (life sciences)  . Smokeless tobacco: Not on file  . Alcohol Use: No  . Drug Use: No  . Sexual Activity: No   Other Topics Concern  . Not on file   Social History Narrative  . No narrative on file    REVIEW OF SYSTEMS: Constitutional: No fevers, chills, or sweats, no generalized fatigue, change in appetite Eyes: No visual changes, double vision, eye pain Ear, nose and throat: No hearing loss, ear pain, nasal congestion, sore throat Cardiovascular: No chest pain, palpitations Respiratory:  No shortness of breath at rest or with exertion, wheezes GastrointestinaI: No nausea, vomiting, diarrhea, abdominal pain, fecal incontinence Genitourinary:  No dysuria, urinary retention or frequency Musculoskeletal:  No neck pain, back pain Integumentary: Notes dark red skin lesions on arms.  Not painful or pruritic. Neurological: as above Psychiatric: No depression, insomnia, anxiety Endocrine: No palpitations, fatigue, diaphoresis, mood swings, change in appetite, change in weight, increased thirst Hematologic/Lymphatic:  No anemia, purpura, petechiae. Allergic/Immunologic: no itchy/runny eyes, nasal congestion, recent allergic reactions, rashes  PHYSICAL EXAM: Filed Vitals:   02/04/14 0928  BP: 116/66  Pulse: 68   General: No acute distress Head:  Normocephalic/atraumatic Neck: supple, no paraspinal tenderness, full range of motion Heart:   Regular rate and rhythm Lungs:  Clear to auscultation bilaterally Back: No paraspinal tenderness Neurological Exam: alert and oriented to person, place, and time. Attention span and concentration intact, recent and remote memory intact, fund of knowledge intact.  Speech fluent and not dysarthric, language intact.  CN II-XII intact. Fundi not visualized.  Bulk and tone normal, muscle strength 5/5 throughout.  Sensation to light touch intact.  Deep tendon reflexes 2+ throughout.  Finger to nose testing intact.  Gait normal.  IMPRESSION: Right unilateral headache.  Does not fit exactly a criteria for a specific headache syndrome.  PLAN: 1.  Will start sertraline 25mg  daily.  She will call in 4 weeks with update. 2.  If she has another attack, we will try an indomethacin trial.  It really wouldn't be a diagnostic test for hemicrania continua because it lasts only about a week anyway.  3.  Follow up around [place], DO  CC:  Annye Asa, MD

## 2014-02-04 NOTE — Telephone Encounter (Signed)
Spoke with patient and she will proceed with taking sertraline. She will call if any problems and if headaches reoccur.

## 2014-02-22 ENCOUNTER — Telehealth: Payer: Self-pay | Admitting: Neurology

## 2014-02-22 ENCOUNTER — Other Ambulatory Visit: Payer: Self-pay | Admitting: *Deleted

## 2014-02-22 ENCOUNTER — Telehealth: Payer: Self-pay | Admitting: *Deleted

## 2014-02-22 NOTE — Telephone Encounter (Signed)
Pt called/returning your call at 9:49AM. C/B [phone]

## 2014-02-22 NOTE — Telephone Encounter (Signed)
120 zoloft 25 mg called to pharmacy [facility] in [place] patient will be out of town until June 2016 this will hold her over until she returns  in June she has a 90 day supply on hold as well

## 2014-02-22 NOTE — Telephone Encounter (Signed)
Pt was not able to r/s her 04/20/14 f/u appt for now. Pt will call later to r/s

## 2014-02-22 NOTE — Telephone Encounter (Signed)
Pt is going to [place] and wants a refilled called in on sertaline to [facility] at [phone] pt phone number is [phone]

## 2014-03-01 ENCOUNTER — Telehealth: Payer: Self-pay | Admitting: *Deleted

## 2014-03-01 ENCOUNTER — Telehealth: Payer: Self-pay | Admitting: Neurology

## 2014-03-01 ENCOUNTER — Other Ambulatory Visit: Payer: Self-pay | Admitting: *Deleted

## 2014-03-01 MED ORDER — DULOXETINE HCL 30 MG PO CPEP: 30.0000 mg | ORAL_CAPSULE | Freq: Every day | ORAL | Status: DC

## 2014-03-01 NOTE — Telephone Encounter (Signed)
patient is aware of change in medication to  cymbalta 30 mg 1 PO QD this was called into pharmacy

## 2014-03-01 NOTE — Telephone Encounter (Signed)
Pt called requesting to speak to a nurse regarding SERTRALINE 25mg . Pt is having an upset stomach and thinks is from the sertraline script. C/B [phone]

## 2014-03-01 NOTE — Telephone Encounter (Signed)
Patient states the Sertraline is really making her sick at her stomach and she is having diarrhea really bad please advise . She has not taking the medication today

## 2014-03-01 NOTE — Telephone Encounter (Signed)
Instead, we can try Cymbalta 30mg  daily.

## 2014-03-06 ENCOUNTER — Other Ambulatory Visit: Payer: Self-pay | Admitting: Neurology

## 2014-04-15 ENCOUNTER — Ambulatory Visit: Payer: Medicare Other | Admitting: Neurology

## 2014-04-15 ENCOUNTER — Encounter: Payer: Self-pay | Admitting: Neurology

## 2014-04-15 ENCOUNTER — Ambulatory Visit (INDEPENDENT_AMBULATORY_CARE_PROVIDER_SITE_OTHER): Payer: Medicare Other | Admitting: Neurology

## 2014-04-15 VITALS — BP 116/70 | HR 78 | Temp 97.5°F | Resp 16 | Ht 65.0 in | Wt 153.0 lb

## 2014-04-15 DIAGNOSIS — R51 Headache: Secondary | ICD-10-CM

## 2014-04-15 DIAGNOSIS — R519 Headache, unspecified: Secondary | ICD-10-CM

## 2014-04-15 NOTE — Progress Notes (Signed)
NEUROLOGY FOLLOW UP OFFICE NOTE  Khadejah Son [id number]  HISTORY OF PRESENT ILLNESS: Jennifer Boone is a 80 year old right-handed woman with hypertension, hyperlipidemia, hyperglycemia, and history of orbital pseudotumor who follows up for right unilateral headache.  She is accompanied by her husband.    UPDATE: She was started on sertraline, which caused GI upset.  She was switched to Cymbalta 30mg , however she never started it because the GI upset persisted for a little while.  She hasn't had a headache, so she never started it.  She is doing well.  HISTORY: On 07/12/12, she woke up with mild swelling, redness and ptosis of the right eye.  There was no vision loss or double vision.  She was evaluated by ophthalmology.  On 07/14/12, she reportedly had carotid duplex and MRI of the brain, which were normal.  Her ptosis improved while on a prednisone taper, but returned once she finished it.  She saw a neurologist, Dr. Orvil Feil, in April 2014.  She was given a trial of Mestinon.  The ptosis did improve but the Mestinon was discontinued due to diarrhea and intense headache.  MRI of the orbits was performed on 08/20/12 and revealed a mass behind the right orbit.  She was referred to Dr. Rosendo Gros, ophthalmologist at [facility].  Repeat MRI of the brain and orbits with and without contrast was performed on 10/03/12, which revealed interval diminished enlargement and enhancement of the right superior rectus and levator palpebral superioris muscle and right superior oblique muscle, as well as diminished enhancement along the adjacent dura of the anterior cranial fossa on the right.  She did not have any further problems until June 2015.  She developed a headache.  It involves the right side of her occipital region, sometimes involving the right frontal region.  It is a constant squeezing pain, 9/10 intensity.  There is no neck pain.  There are no associated symptoms, such as nausea, vomiting, photophobia,  phonophobia or autonomic symptoms.  There is no recurrence of right eye ptosis or eye pain or visual disturbance.  They seem to occur in cycles, with periods lasting 5 to 7 days straight, occurring once a month.  She had another round of prednisone, which did not help.  Extra-strength Tylenol was minimally effective.  She tried an Imitrex once, which helped.  She most recently followed up with Dr. Barb Merino, ophthalmology at [facility] in July.  Her eye exam was unremarkable.  Blood work performed 11/16/13 revealed Sed Rate 5, CRP 0.5, WBC 7.4, HGB 13.7, HCT 42.6, and PLT 303.  She has no prior history of headache.  MRI of the brain with and without contrast was performed on 01/28/14, which revealed generalized cerebral and cerebellar atrophy and small vessel ischemic changes, but no acute abnormalities or abnormal enhancement of the meninges, orbit or extraocular muscles.  PAST MEDICAL HISTORY: Past Medical History  Diagnosis Date  . Hyperlipidemia   . Hypertension   . Arthritis   . IBS (irritable bowel syndrome)   . Arrhythmia   . 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 aspirin 81 MG tablet Take 81 mg by mouth daily.      . fish oil-omega-3 fatty acids 1000 MG capsule Take 1 g by mouth daily.      . fluticasone (FLONASE) 50 MCG/ACT nasal spray Place 1 spray into both nostrils daily. [place]  g 6  . glucosamine-chondroitin 500-400 MG tablet Take 1 tablet by mouth 3 (three) times daily.      . Multiple Vitamin (MULTIVITAMIN) tablet Take 1 tablet by mouth daily.      Marland Kitchen omeprazole (PRILOSEC) 20 MG capsule Take 20 mg by mouth daily.      . simvastatin (ZOCOR) 40 MG tablet Take 40 mg by mouth at bedtime.      . DULoxetine (CYMBALTA) 30 MG capsule Take 1 capsule (30 mg total) by mouth daily. (Patient not taking: Reported on 04/15/2014) 90 capsule 3  . sertraline (ZOLOFT) 25 MG tablet TAKE 1  TABLET BY MOUTH DAILY (Patient not taking: Reported on 04/15/2014) 90 tablet 0  . sertraline (ZOLOFT) 25 MG tablet TAKE 1 TABLET BY MOUTH DAILY (Patient not taking: Reported on 04/15/2014) 30 tablet 0  . SUMAtriptan (IMITREX) 50 MG tablet Take 1 tablet (50 mg total) by mouth once. May repeat in 2 hours if headache persists or recurs. (Patient not taking: Reported on 04/15/2014) 10 tablet 3   No current facility-administered medications on file prior to visit.    ALLERGIES: Allergies  Allergen Reactions  . Celebrex [Celecoxib]     hives  . Amoxicillin     REACTION: hives  . Cefuroxime Axetil     REACTION: throat closes!!!!!  . Clindamycin     REACTION: hives  . Tetracycline     REACTION: GI upset    FAMILY HISTORY: Family History  Problem Relation Age of Onset  . Heart disease Mother   . Diabetes Sister   . Heart disease Sister     SOCIAL HISTORY: History   Social History  . Marital Status: Married    Spouse Name: N/A    Number of Children: N/A  . Years of Education: N/A   Occupational History  . Not on file.   Social History Main Topics  . Smoking status: Former Research scientist (life sciences)  . Smokeless tobacco: Not on file  . Alcohol Use: No  . Drug Use: No  . Sexual Activity: No   Other Topics Concern  . Not on file   Social History Narrative  . No narrative on file    REVIEW OF SYSTEMS: Constitutional: No fevers, chills, or sweats, no generalized fatigue, change in appetite Eyes: No visual changes, double vision, eye pain Ear, nose and throat: No hearing loss, ear pain, nasal congestion, sore throat Cardiovascular: No chest pain, palpitations Respiratory:  No shortness of breath at rest or with exertion, wheezes GastrointestinaI: No nausea, vomiting, diarrhea, abdominal pain, fecal incontinence Genitourinary:  No dysuria, urinary retention or frequency Musculoskeletal:  No neck pain, back pain Integumentary: No rash, pruritus, skin lesions Neurological: as  above Psychiatric: No depression, insomnia, anxiety Endocrine: No palpitations, fatigue, diaphoresis, mood swings, change in appetite, change in weight, increased thirst Hematologic/Lymphatic:  No anemia, purpura, petechiae. Allergic/Immunologic: no itchy/runny eyes, nasal congestion, recent allergic reactions, rashes  PHYSICAL EXAM: Filed Vitals:   04/15/14 1123  BP: 116/70  Pulse: 78  Temp: 97.5 F (36.4 C)  Resp: 16   General: No acute distress Head:  Normocephalic/atraumatic Eyes:  Fundoscopic exam unremarkable without vessel changes, exudates, hemorrhages or papilledema. Neck: supple, no paraspinal tenderness, full range of motion Heart:  Regular rate and rhythm Lungs:  Clear to auscultation bilaterally Back: No paraspinal tenderness Neurological Exam: alert and oriented to person, place, and time. Attention span and concentration intact, recent and remote memory intact, fund of knowledge intact.  Speech fluent and not dysarthric, language intact.  CN II-XII intact. Fundoscopic exam unremarkable without vessel changes, exudates, hemorrhages or papilledema.  Bulk and tone normal, muscle strength 5/5 throughout.  Sensation to light touch intact.  Deep tendon reflexes 2+ throughout.  Finger to nose testing intact.  Gait normal  IMPRESSION: Right sided headache, stable at this time.  PLAN: 1.  Will monitor for now.  If headache returns, she will start the Cymbalta 30mg  daily and call with any problems. 2.  After Christmas, she and her husband will be going back down to [place] for the winter.  She will follow up in June after she returns.  15 minutes spent with patient, over 50% spent discussing management.  Metta Clines, DO  CC:  Annye Asa, MD

## 2014-04-15 NOTE — Patient Instructions (Signed)
1.  If the headache returns, start the duloxetine daily.  Call with any questions or concerns. 2.  Let's schedule a follow-up in June after you return from [place]. Merry Christmas!

## 2014-04-20 ENCOUNTER — Ambulatory Visit: Payer: Medicare Other | Admitting: Neurology

## 2014-07-08 ENCOUNTER — Encounter: Payer: Self-pay | Admitting: Family Medicine

## 2014-07-25 ENCOUNTER — Encounter: Payer: Self-pay | Admitting: Internal Medicine

## 2014-12-26 LAB — HM DEXA SCAN: HM DEXA SCAN: NORMAL

## 2015-01-11 ENCOUNTER — Ambulatory Visit (INDEPENDENT_AMBULATORY_CARE_PROVIDER_SITE_OTHER): Payer: Medicare Other | Admitting: Medical

## 2015-01-11 ENCOUNTER — Encounter: Payer: Self-pay | Admitting: Medical

## 2015-01-11 VITALS — BP 118/78 | HR 68 | Temp 97.7°F | Resp 16 | Ht 65.0 in | Wt 153.0 lb

## 2015-01-11 DIAGNOSIS — S46812A Strain of other muscles, fascia and tendons at shoulder and upper arm level, left arm, initial encounter: Secondary | ICD-10-CM

## 2015-01-11 MED ORDER — TIZANIDINE HCL 6 MG PO CAPS: ORAL_CAPSULE | ORAL | Status: DC

## 2015-01-11 NOTE — Progress Notes (Signed)
Subjective:    Patient ID: Jennifer Boone, female    DOB: [birth year], 81 y.o.   MRN: [id number]  HPI  Pt in woke up yesterday morning with mild stiff neck. No injury or fall. No out of ordinary activity. Pt states no hx of neck pain in the past. Pt put some vics on her neck last night. Pain is minimal and mostly on moving her head.  No ha, no nausea, no vomiting, no weakness, no fever, no chills, and no st. No skin tenderness.   Review of Systems  Constitutional: Negative for fever, chills and fatigue.  Respiratory: Negative for cough, chest tightness, shortness of breath and wheezing.   Cardiovascular: Negative for chest pain.  Musculoskeletal: Positive for neck pain. Negative for joint swelling, arthralgias and gait problem.  Skin: Negative for rash.  Neurological: Negative for dizziness, facial asymmetry, speech difficulty, weakness and headaches.  Hematological: Negative for adenopathy. Does not bruise/bleed easily.  Psychiatric/Behavioral: Negative for behavioral problems and confusion.     Past Medical History  Diagnosis Date  . Hyperlipidemia   . Hypertension   . Arthritis   . IBS (irritable bowel syndrome)   . Arrhythmia   . 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 Marital Status: Married    Spouse Name: N/A  . Number of Children: N/A  . Years of Education: N/A   Occupational History  . Not on file.   Social History Main Topics  . Smoking status: Former Research scientist (life sciences)  . Smokeless tobacco: Not on file  . Alcohol Use: No  . Drug Use: No  . Sexual Activity: No   Other Topics Concern  . Not on file   Social History Narrative    Past Surgical History  Procedure Laterality Date  . Appendectomy    . Cholecystectomy    . Bowel resection    . Hemorroidectomy    . Rotator cuff repair    . Abdominal hysterectomy    . Tonsillectomy    . Cataract extraction      Family History  Problem Relation Age of Onset  . Heart disease  Mother   . Diabetes Sister   . Heart disease Sister     Allergies  Allergen Reactions  . Celebrex [Celecoxib]     hives  . Amoxicillin     REACTION: hives  . Cefuroxime Axetil     REACTION: throat closes!!!!!  . Clindamycin     REACTION: hives  . Tetracycline     REACTION: GI upset    Current Outpatient Prescriptions on File Prior to Visit  Medication Sig Dispense Refill  . aspirin 81 MG tablet Take 81 mg by mouth daily.      . fish oil-omega-3 fatty acids 1000 MG capsule Take 1 g by mouth daily.      . fluticasone (FLONASE) 50 MCG/ACT nasal spray Place 1 spray into both nostrils daily. 16 g 6  . glucosamine-chondroitin 500-400 MG tablet Take 1 tablet by mouth 3 (three) times daily.      . Multiple Vitamin (MULTIVITAMIN) tablet Take 1 tablet by mouth daily.      Marland Kitchen omeprazole (PRILOSEC) 20 MG capsule Take 20 mg by mouth daily.      . simvastatin (ZOCOR) 40 MG tablet Take 40 mg by mouth at bedtime.       No current facility-administered medications on file prior to visit.    BP 118/78 mmHg  Pulse 68  Temp(Src) 97.7 F (36.5 C) (Oral)  Resp 16  Ht 5\' 5"  (1.651 m)  Wt 153 lb (69.4 kg)  BMI 25.46 kg/m2  SpO2 97%       Objective:   Physical Exam  General- No acute distress. Pleasant patient. Neck- Full range of motion, no jvd no mid cspine tenderness. Rt trapezius tenderness to palpation over upper portion where insets into occipital region. No lymph node felt. On range of motion. Rt upper trap pain can be induced. Lungs- Clear, even and unlabored. Heart- regular rate and rhythm. Neurologic- CNII- XII  intact. Skin- of scalp feels normal. Appears normal.   HEENT Head- Normal. Ear Auditory Canal - Left- Normal. Right - Normal.Tympanic Membrane- Left- Normal. Right- Normal. Eye Sclera/Conjunctiva- Left- Normal. Right- Normal. Nose & Sinuses Nasal Mucosa- Left-  No  Boggy and Congested. Right-   No Boggy and  Congested.Bilatera no l maxillary and no  frontal sinus  pressure. Mouth & Throat Lips: Upper Lip- Normal: no dryness, cracking, pallor, cyanosis, or vesicular eruption. Lower Lip-Normal: no dryness, cracking, pallor, cyanosis or vesicular eruption. Buccal Mucosa- Bilateral- No Aphthous ulcers. Oropharynx- No Discharge or Erythema. Tonsils: Characteristics- Bilateral- No Erythema or Congestion. Size/Enlargement- Bilateral- No enlargement. Discharge- bilateral-None.            Assessment & Plan:  Your appear to have rt trapezius strain upper portion. Would recommend alleve otc twice daily and use zanaflex at night.  Your scalp looks normal indicating notregion skin infection. HEENT exam normal as well. No abnormal  lymph nodes palpated.  You should gradually improve. If symptoms change please notify us.  Follow up in 10 days any persisting symptoms or as needed.

## 2015-01-11 NOTE — Patient Instructions (Addendum)
Your appear to have rt trapezius strain upper portion. Would recommend alleve otc twice daily and use zanaflex at night.  Your scalp looks normal indicating notregion skin infection. HEENT exam normal as well. No abnormal  lymph nodes palpated.  You should gradually improve. If symptoms change please notify us.  Follow up in 10 days any persisting symptoms or as needed.

## 2015-01-11 NOTE — Progress Notes (Signed)
Pre visit review using our clinic review tool, if applicable. No additional management support is needed unless otherwise documented below in the visit note. 

## 2015-01-17 ENCOUNTER — Ambulatory Visit (INDEPENDENT_AMBULATORY_CARE_PROVIDER_SITE_OTHER): Payer: Medicare Other | Admitting: Family Medicine

## 2015-01-17 ENCOUNTER — Encounter: Payer: Self-pay | Admitting: Family Medicine

## 2015-01-17 VITALS — BP 120/80 | HR 63 | Temp 97.9°F | Resp 17 | Wt 153.2 lb

## 2015-01-17 DIAGNOSIS — H6981 Other specified disorders of Eustachian tube, right ear: Secondary | ICD-10-CM | POA: Diagnosis not present

## 2015-01-17 DIAGNOSIS — M542 Cervicalgia: Secondary | ICD-10-CM | POA: Insufficient documentation

## 2015-01-17 DIAGNOSIS — M6248 Contracture of muscle, other site: Secondary | ICD-10-CM | POA: Diagnosis not present

## 2015-01-17 DIAGNOSIS — M62838 Other muscle spasm: Secondary | ICD-10-CM

## 2015-01-17 DIAGNOSIS — Z23 Encounter for immunization: Secondary | ICD-10-CM | POA: Diagnosis not present

## 2015-01-17 MED ORDER — CYCLOBENZAPRINE HCL 10 MG PO TABS: ORAL_TABLET | ORAL | Status: DC

## 2015-01-17 NOTE — Assessment & Plan Note (Signed)
New.  Suspect that this is the cause of pt's R ear as her R TM is significantly retracted.  Restart nasal steroid and add daily OTC antihistamine.  If no improvement in pain, will need ENT referral for complete evaluation.  Pt expressed understanding and is in agreement w/ plan.

## 2015-01-17 NOTE — Progress Notes (Signed)
Pre visit review using our clinic review tool, if applicable. No additional management support is needed unless otherwise documented below in the visit note. 

## 2015-01-17 NOTE — Assessment & Plan Note (Signed)
New to provider, ongoing for pt.  No relief w/ Zanaflex.  Due to pt's previous bad reaction to NSAID will hold off at this time.  Start Flexeril and heat.  If no improvement will move to prednisone.  Reviewed supportive care and red flags that should prompt return.  Pt expressed understanding and is in agreement w/ plan.

## 2015-01-17 NOTE — Progress Notes (Signed)
   Subjective:    Patient ID: Jennifer Boone, female    DOB: [birth year], 81 y.o.   MRN: [id number]  HPI R ear pain- pt was seen 6 days ago w/ R sided stiff neck.  Was dx'd w/ trap strain and started on muscle relaxer.  No improvement w/ medication.  Pain is now bilateral, described as a 'tightness' at base of skull.  And yesterday developed shooting R ear pain.  No current pain.  No nausea or vomiting.  No dizziness.  No hearing changes.  No blurry or double vision.   Review of Systems For ROS see HPI     Objective:   Physical Exam  Constitutional: She is oriented to person, place, and time. She appears well-developed and well-nourished. No distress.  HENT:  Head: Normocephalic and atraumatic.  Right Ear: Tympanic membrane is retracted.  Left Ear: Tympanic membrane normal.  Nose: Mucosal edema and rhinorrhea present. Right sinus exhibits no maxillary sinus tenderness and no frontal sinus tenderness. Left sinus exhibits no maxillary sinus tenderness and no frontal sinus tenderness.  Mouth/Throat: Mucous membranes are normal. Posterior oropharyngeal erythema (w/ PND) present.  Eyes: Conjunctivae and EOM are normal. Pupils are equal, round, and reactive to light.  Neck: Normal range of motion. Neck supple.  Bilateral upper trap spasm w/ mild TTP  Cardiovascular: Normal rate, regular rhythm and normal heart sounds.   Pulmonary/Chest: Effort normal and breath sounds normal. No respiratory distress. She has no wheezes. She has no rales.  Lymphadenopathy:    She has no cervical adenopathy.  Neurological: She is alert and oriented to person, place, and time. No cranial nerve deficit. Coordination normal.  Skin: Skin is warm and dry.  Psychiatric: She has a normal mood and affect. Her behavior is normal.  Vitals reviewed.         Assessment & Plan:

## 2015-01-17 NOTE — Patient Instructions (Signed)
Follow up as needed Restart Flonase- 2 sprays each nostril daily Take Zyrtec daily- store brand generic works just as well Drink plenty of fluids Continue the muscle relaxer as needed for neck pain/spasm- particularly at night Use a heating pad as needed for pain/spasm If no improvement in ear pain or neck pain- please let me know so we can work on the next steps Call with any questions or concerns If you want to join [place] at the new [place] office, any scheduled appointments will automatically transfer and we will see you at [address] [place] [address] [place], [place] [address]  Happy [place][birth year] Birthday!!!

## 2015-05-15 DIAGNOSIS — R51 Headache: Secondary | ICD-10-CM | POA: Diagnosis not present

## 2015-05-15 DIAGNOSIS — M810 Age-related osteoporosis without current pathological fracture: Secondary | ICD-10-CM | POA: Diagnosis not present

## 2015-06-09 DIAGNOSIS — M19041 Primary osteoarthritis, right hand: Secondary | ICD-10-CM | POA: Diagnosis not present

## 2015-06-09 DIAGNOSIS — M19042 Primary osteoarthritis, left hand: Secondary | ICD-10-CM | POA: Diagnosis not present

## 2015-06-14 DIAGNOSIS — L570 Actinic keratosis: Secondary | ICD-10-CM | POA: Diagnosis not present

## 2015-06-14 DIAGNOSIS — D1801 Hemangioma of skin and subcutaneous tissue: Secondary | ICD-10-CM | POA: Diagnosis not present

## 2015-06-14 DIAGNOSIS — L812 Freckles: Secondary | ICD-10-CM | POA: Diagnosis not present

## 2015-06-14 DIAGNOSIS — D2272 Melanocytic nevi of left lower limb, including hip: Secondary | ICD-10-CM | POA: Diagnosis not present

## 2015-06-14 DIAGNOSIS — D2261 Melanocytic nevi of right upper limb, including shoulder: Secondary | ICD-10-CM | POA: Diagnosis not present

## 2015-06-14 DIAGNOSIS — D2271 Melanocytic nevi of right lower limb, including hip: Secondary | ICD-10-CM | POA: Diagnosis not present

## 2015-06-14 DIAGNOSIS — L728 Other follicular cysts of the skin and subcutaneous tissue: Secondary | ICD-10-CM | POA: Diagnosis not present

## 2015-06-14 DIAGNOSIS — D692 Other nonthrombocytopenic purpura: Secondary | ICD-10-CM | POA: Diagnosis not present

## 2015-06-14 DIAGNOSIS — D225 Melanocytic nevi of trunk: Secondary | ICD-10-CM | POA: Diagnosis not present

## 2015-06-14 DIAGNOSIS — L821 Other seborrheic keratosis: Secondary | ICD-10-CM | POA: Diagnosis not present

## 2015-06-14 DIAGNOSIS — L400 Psoriasis vulgaris: Secondary | ICD-10-CM | POA: Diagnosis not present

## 2015-06-14 DIAGNOSIS — I83893 Varicose veins of bilateral lower extremities with other complications: Secondary | ICD-10-CM | POA: Diagnosis not present

## 2015-06-20 DIAGNOSIS — Z1231 Encounter for screening mammogram for malignant neoplasm of breast: Secondary | ICD-10-CM | POA: Diagnosis not present

## 2015-07-17 DIAGNOSIS — R51 Headache: Secondary | ICD-10-CM | POA: Diagnosis not present

## 2015-07-17 DIAGNOSIS — F17200 Nicotine dependence, unspecified, uncomplicated: Secondary | ICD-10-CM | POA: Diagnosis not present

## 2015-07-17 DIAGNOSIS — M1990 Unspecified osteoarthritis, unspecified site: Secondary | ICD-10-CM | POA: Diagnosis not present

## 2015-07-17 DIAGNOSIS — I10 Essential (primary) hypertension: Secondary | ICD-10-CM | POA: Diagnosis not present

## 2015-07-28 DIAGNOSIS — Z1211 Encounter for screening for malignant neoplasm of colon: Secondary | ICD-10-CM | POA: Diagnosis not present

## 2015-07-28 DIAGNOSIS — Z1212 Encounter for screening for malignant neoplasm of rectum: Secondary | ICD-10-CM | POA: Diagnosis not present

## 2015-08-15 DIAGNOSIS — M19042 Primary osteoarthritis, left hand: Secondary | ICD-10-CM | POA: Diagnosis not present

## 2015-08-15 DIAGNOSIS — M19041 Primary osteoarthritis, right hand: Secondary | ICD-10-CM | POA: Diagnosis not present

## 2015-08-28 DIAGNOSIS — M19041 Primary osteoarthritis, right hand: Secondary | ICD-10-CM | POA: Diagnosis not present

## 2015-08-28 DIAGNOSIS — M19042 Primary osteoarthritis, left hand: Secondary | ICD-10-CM | POA: Diagnosis not present

## 2015-09-06 DIAGNOSIS — H35363 Drusen (degenerative) of macula, bilateral: Secondary | ICD-10-CM | POA: Diagnosis not present

## 2015-09-06 DIAGNOSIS — H04123 Dry eye syndrome of bilateral lacrimal glands: Secondary | ICD-10-CM | POA: Diagnosis not present

## 2015-09-19 DIAGNOSIS — R51 Headache: Secondary | ICD-10-CM | POA: Diagnosis not present

## 2015-09-19 DIAGNOSIS — E7800 Pure hypercholesterolemia, unspecified: Secondary | ICD-10-CM | POA: Diagnosis not present

## 2015-09-19 DIAGNOSIS — I10 Essential (primary) hypertension: Secondary | ICD-10-CM | POA: Diagnosis not present

## 2015-11-21 DIAGNOSIS — M19042 Primary osteoarthritis, left hand: Secondary | ICD-10-CM | POA: Diagnosis not present

## 2015-11-21 DIAGNOSIS — M19041 Primary osteoarthritis, right hand: Secondary | ICD-10-CM | POA: Diagnosis not present

## 2015-12-26 ENCOUNTER — Encounter: Payer: Self-pay | Admitting: Family Medicine

## 2015-12-26 ENCOUNTER — Ambulatory Visit (INDEPENDENT_AMBULATORY_CARE_PROVIDER_SITE_OTHER): Payer: Medicare Other | Admitting: Family Medicine

## 2015-12-26 ENCOUNTER — Encounter: Payer: Self-pay | Admitting: General Practice

## 2015-12-26 VITALS — BP 123/84 | HR 62 | Temp 98.1°F | Resp 16 | Ht 65.0 in | Wt 153.1 lb

## 2015-12-26 DIAGNOSIS — M6248 Contracture of muscle, other site: Secondary | ICD-10-CM | POA: Diagnosis not present

## 2015-12-26 DIAGNOSIS — M62838 Other muscle spasm: Secondary | ICD-10-CM

## 2015-12-26 MED ORDER — PREDNISONE 10 MG PO TABS: ORAL_TABLET | ORAL | 0 refills | Status: DC

## 2015-12-26 NOTE — Assessment & Plan Note (Signed)
Recurrent issue for pt.  I suspect that this is due to her recent travel and sleeping on new and different pillows.  Continue Flexeril.  Add Pred taper.  Reviewed supportive care and red flags that should prompt return.  Pt expressed understanding and is in agreement w/ plan.

## 2015-12-26 NOTE — Progress Notes (Signed)
   Subjective:    Patient ID: Jennifer Boone, female    DOB: 05-17-1933, 82 y.o.   MRN: [id number]  HPI Neck pain- sxs started ~4 days ago and she initially thought she had slept on it wrong.  R sided initially but this radiated across to R side and up into head.  Had difficulty moving neck.  Pt took left over flexeril 5mg  w/ improvement.  Movement worsens pain.  No visual changes.  No HA, no dizziness.  Pt was recently in the car for 10 hrs.  Using heating pad.  Pt has been taking 2 Aleve daily w/o difficulty.   Review of Systems For ROS see HPI     Objective:   Physical Exam  Constitutional: She is oriented to person, place, and time. She appears well-developed and well-nourished. No distress.  HENT:  Head: Normocephalic and atraumatic.  Eyes: EOM are normal. Pupils are equal, round, and reactive to light.  Neck: Neck supple. No thyromegaly present.  Painful neck rotation, pain w/ extension>flexion + trap spasm on R and L  Lymphadenopathy:    She has no cervical adenopathy.  Neurological: She is alert and oriented to person, place, and time. She has normal reflexes. No cranial nerve deficit.  Skin: Skin is warm and dry. No rash noted. No erythema.  Psychiatric: She has a normal mood and affect. Her behavior is normal. Thought content normal.  Vitals reviewed.         Assessment & Plan:

## 2015-12-26 NOTE — Patient Instructions (Signed)
Follow up as needed Start the Prednisone taper today- all 3 pills at same time.  Take w/ food No additional Ibuprofen or Aleve while on the prednisone but you can add Tylenol as needed HEAT!!! Use the Cyclobenzaprine up to 3x/day for spasm relief Call with any questions or concerns Hang in there!!!

## 2015-12-26 NOTE — Progress Notes (Signed)
Pre visit review using our clinic review tool, if applicable. No additional management support is needed unless otherwise documented below in the visit note. 

## 2016-01-05 ENCOUNTER — Ambulatory Visit (HOSPITAL_BASED_OUTPATIENT_CLINIC_OR_DEPARTMENT_OTHER)
Admission: RE | Admit: 2016-01-05 | Discharge: 2016-01-05 | Disposition: A | Discharge status: Home / Self Care | Payer: Medicare Other | Source: Ambulatory Visit | Attending: Family Medicine | Admitting: Family Medicine

## 2016-01-05 ENCOUNTER — Encounter: Payer: Self-pay | Admitting: Family Medicine

## 2016-01-05 ENCOUNTER — Ambulatory Visit (INDEPENDENT_AMBULATORY_CARE_PROVIDER_SITE_OTHER): Payer: Medicare Other | Admitting: Family Medicine

## 2016-01-05 VITALS — BP 124/82 | HR 57 | Temp 98.1°F | Resp 16 | Ht 65.0 in | Wt 155.1 lb

## 2016-01-05 DIAGNOSIS — M47892 Other spondylosis, cervical region: Secondary | ICD-10-CM | POA: Insufficient documentation

## 2016-01-05 DIAGNOSIS — M542 Cervicalgia: Secondary | ICD-10-CM

## 2016-01-05 DIAGNOSIS — M47812 Spondylosis without myelopathy or radiculopathy, cervical region: Secondary | ICD-10-CM | POA: Diagnosis not present

## 2016-01-05 MED ORDER — PREDNISONE 10 MG PO TABS: ORAL_TABLET | ORAL | 0 refills | Status: DC

## 2016-01-05 MED ORDER — CYCLOBENZAPRINE HCL 10 MG PO TABS: ORAL_TABLET | ORAL | 0 refills | Status: DC

## 2016-01-05 NOTE — Patient Instructions (Signed)
Follow up as needed [facility] to the [facility] on [address] to have your xrays done [facility] call you with your xray results once they are available We'll notify you of your PT appt Restart the Prednisone as directed- take w/ food Use the Flexeril as needed for spasm Call with any questions or concerns Hang in there!!!

## 2016-01-05 NOTE — Progress Notes (Signed)
Pre visit review using our clinic review tool, if applicable. No additional management support is needed unless otherwise documented below in the visit note. 

## 2016-01-05 NOTE — Progress Notes (Signed)
   Subjective:    Patient ID: Jennifer Boone, female    DOB: 1933-10-15, 82 y.o.   MRN: [id number]  HPI Neck pain- pt seen for same thing on 8/29.  States prednisone 'made it feel really good' but she finished the taper on Wednesday and pain returned- mild at first but worsening w/ time.  Today described as a 'pulsing'.  R sided, posterior neck.  No relief w/ Flexeril.   Review of Systems For ROS see HPI     Objective:   Physical Exam  Constitutional: She is oriented to person, place, and time. She appears well-developed and well-nourished. No distress.  HENT:  Head: Normocephalic and atraumatic.  Eyes: Conjunctivae and EOM are normal. Pupils are equal, round, and reactive to light.  Neck: Normal range of motion. Neck supple.  Mild TTP over cervical spine  Lymphadenopathy:    She has no cervical adenopathy.  Neurological: She is alert and oriented to person, place, and time. She has normal reflexes. No cranial nerve deficit. Coordination normal.  Skin: Skin is warm and dry.  Psychiatric: She has a normal mood and affect. Her behavior is normal. Thought content normal.  Vitals reviewed.         Assessment & Plan:

## 2016-01-08 NOTE — Assessment & Plan Note (Signed)
Ongoing issue for pt.  sxs improved w/ prednisone but returned when she finished taper.  Pt has hx of cervical disc issues- will get xray to assess for degenerative changes.  Restart prednisone.  If no improvement on 2nd pred taper and no obvious cause on Xrays, pt will need referral to ortho.  Reviewed supportive care and red flags that should prompt return.  Pt expressed understanding and is in agreement w/ plan.

## 2016-01-11 DIAGNOSIS — M542 Cervicalgia: Secondary | ICD-10-CM | POA: Diagnosis not present

## 2016-01-16 DIAGNOSIS — M542 Cervicalgia: Secondary | ICD-10-CM | POA: Diagnosis not present

## 2016-01-18 DIAGNOSIS — M542 Cervicalgia: Secondary | ICD-10-CM | POA: Diagnosis not present

## 2016-01-23 DIAGNOSIS — M542 Cervicalgia: Secondary | ICD-10-CM | POA: Diagnosis not present

## 2016-01-23 DIAGNOSIS — Z23 Encounter for immunization: Secondary | ICD-10-CM | POA: Diagnosis not present

## 2016-01-25 DIAGNOSIS — M542 Cervicalgia: Secondary | ICD-10-CM | POA: Diagnosis not present

## 2016-02-01 DIAGNOSIS — M542 Cervicalgia: Secondary | ICD-10-CM | POA: Diagnosis not present

## 2016-02-06 DIAGNOSIS — M542 Cervicalgia: Secondary | ICD-10-CM | POA: Diagnosis not present

## 2016-02-07 ENCOUNTER — Ambulatory Visit (INDEPENDENT_AMBULATORY_CARE_PROVIDER_SITE_OTHER): Payer: Medicare Other | Admitting: Family Medicine

## 2016-02-07 ENCOUNTER — Encounter: Payer: Self-pay | Admitting: Family Medicine

## 2016-02-07 VITALS — BP 122/70 | HR 60 | Temp 98.2°F | Ht 65.0 in | Wt 155.0 lb

## 2016-02-07 DIAGNOSIS — R229 Localized swelling, mass and lump, unspecified: Secondary | ICD-10-CM | POA: Diagnosis not present

## 2016-02-07 DIAGNOSIS — IMO0001 Reserved for inherently not codable concepts without codable children: Secondary | ICD-10-CM

## 2016-02-07 NOTE — Patient Instructions (Addendum)
Continue to use ice and Tylenol. If you start developing spreading redness, fevers, drainage from the area, or worsening pain, seek medical care.

## 2016-02-07 NOTE — Progress Notes (Signed)
Chief Complaint  Patient presents with  . Arm Pain    Pt reports noticing a lump appear on the Left arm /Pt reports not injuring herself to get the lump but wants to be evaluated for it     Subjective:  Patient is a 82 y.o. female here for a bump on her arm. Here with husband.  Small red bump on L arm, started yesterday. Getting smaller and less painful with ice. It does not itch. No sick contacts. No fevers, drainage, or bruising.  ROS: Const: Denies fevers  Family History  Problem Relation Age of Onset  . Heart disease Mother   . Diabetes Sister   . Heart disease Sister    Past Medical History:  Diagnosis Date  . Arrhythmia   . Arthritis   . Hyperlipidemia   . Hypertension   . IBS (irritable bowel syndrome)   . 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 Celebrex [Celecoxib]     Hives, throat closes  . Tape     Hives, cannot tolerate band-aids.   . Amoxicillin     REACTION: hives  . Cefuroxime Axetil Hives  . Clindamycin     REACTION: hives  . Codeine     Cannot remember, bad reaction  . Tetracycline     REACTION: GI upset    Current Outpatient Prescriptions:  .  aspirin 81 MG tablet, Take 81 mg by mouth daily.  , Disp: , Rfl:  .  Coenzyme Q10 (COQ-10) 100 MG CAPS, Take by mouth., Disp: , Rfl:  .  cyclobenzaprine (FLEXERIL) 10 MG tablet, 1/2-1 tab q8 hrs as needed for muscle spasm, Disp: 30 tablet, Rfl: 0 .  FIBER COMPLETE PO, Take by mouth., Disp: , Rfl:  .  glucosamine-chondroitin 500-400 MG tablet, Take 1 tablet by mouth 3 (three) times daily.  , Disp: , Rfl:  .  omeprazole (PRILOSEC) 20 MG capsule, Take 20 mg by mouth daily.  , Disp: , Rfl:  .  propranolol (INDERAL) 10 MG tablet, Take 10 mg by mouth 3 (three) times daily., Disp: , Rfl:  .  simvastatin (ZOCOR) 40 MG tablet, Take 40 mg by mouth at bedtime.  , Disp: , Rfl:  .  SUMAtriptan (IMITREX) 50 MG tablet, Take 50 mg by mouth every 2 (two) hours as needed for migraine. May  repeat in 2 hours if headache persists or recurs., Disp: , Rfl:  .  Multiple Vitamins-Minerals (PRESERVISION AREDS 2 PO), Take by mouth., Disp: , Rfl:   Objective: BP 122/70 (BP Location: Right Arm, Patient Position: Sitting, Cuff Size: Small)   Pulse 60   Temp 98.2 F (36.8 C) (Oral)   Ht 5\' 5"  (1.651 m)   Wt 155 lb (70.3 kg)   SpO2 98%   BMI 25.79 kg/m  General: Awake, appears stated age Skin: there is a 3.2 cm x 2.1 cm elliptical raised area on the lateral L forearm. It is fluctuant and mildly TTP. I do not appreciate significant warmth or erythema overlying the area. There is no opening or drainage.  Psych: Age appropriate judgment and insight, normal affect and mood  Assessment and Plan: Bump  I do not think this is anything sinister. Wrote down on AVS things to look out for. Given hx, likely a local inflammatory rxn 2/2 unknown trauma.  If things worsen or fail to improve, could consider [place] vs biopsy. F/u prn. The patient and her husband voiced understanding and  agreement to the plan.  Amelia, DO 02/07/16  2:07 PM

## 2016-02-07 NOTE — Progress Notes (Signed)
Pre visit review using our clinic review tool, if applicable. No additional management support is needed unless otherwise documented below in the visit note. 

## 2016-03-06 DIAGNOSIS — H04123 Dry eye syndrome of bilateral lacrimal glands: Secondary | ICD-10-CM | POA: Diagnosis not present

## 2016-03-06 DIAGNOSIS — H16143 Punctate keratitis, bilateral: Secondary | ICD-10-CM | POA: Diagnosis not present

## 2016-03-11 DIAGNOSIS — M19031 Primary osteoarthritis, right wrist: Secondary | ICD-10-CM | POA: Diagnosis not present

## 2016-03-11 DIAGNOSIS — M722 Plantar fascial fibromatosis: Secondary | ICD-10-CM | POA: Diagnosis not present

## 2016-03-11 DIAGNOSIS — M19032 Primary osteoarthritis, left wrist: Secondary | ICD-10-CM | POA: Diagnosis not present

## 2016-03-11 DIAGNOSIS — M19041 Primary osteoarthritis, right hand: Secondary | ICD-10-CM | POA: Diagnosis not present

## 2016-03-25 DIAGNOSIS — Z961 Presence of intraocular lens: Secondary | ICD-10-CM | POA: Diagnosis not present

## 2016-03-25 DIAGNOSIS — H04123 Dry eye syndrome of bilateral lacrimal glands: Secondary | ICD-10-CM | POA: Diagnosis not present

## 2016-03-27 DIAGNOSIS — H6591 Unspecified nonsuppurative otitis media, right ear: Secondary | ICD-10-CM | POA: Diagnosis not present

## 2016-03-27 DIAGNOSIS — R05 Cough: Secondary | ICD-10-CM | POA: Diagnosis not present

## 2016-03-27 DIAGNOSIS — J320 Chronic maxillary sinusitis: Secondary | ICD-10-CM | POA: Diagnosis not present

## 2016-04-08 DIAGNOSIS — M79672 Pain in left foot: Secondary | ICD-10-CM | POA: Diagnosis not present

## 2016-04-08 DIAGNOSIS — Z23 Encounter for immunization: Secondary | ICD-10-CM | POA: Diagnosis not present

## 2016-04-08 DIAGNOSIS — S41101A Unspecified open wound of right upper arm, initial encounter: Secondary | ICD-10-CM | POA: Diagnosis not present

## 2016-04-08 DIAGNOSIS — M25572 Pain in left ankle and joints of left foot: Secondary | ICD-10-CM | POA: Diagnosis not present

## 2016-04-08 DIAGNOSIS — W2209XA Striking against other stationary object, initial encounter: Secondary | ICD-10-CM | POA: Diagnosis not present

## 2016-04-30 DIAGNOSIS — M810 Age-related osteoporosis without current pathological fracture: Secondary | ICD-10-CM | POA: Diagnosis not present

## 2016-04-30 DIAGNOSIS — R51 Headache: Secondary | ICD-10-CM | POA: Diagnosis not present

## 2016-04-30 DIAGNOSIS — E7800 Pure hypercholesterolemia, unspecified: Secondary | ICD-10-CM | POA: Diagnosis not present

## 2016-04-30 DIAGNOSIS — Z79899 Other long term (current) drug therapy: Secondary | ICD-10-CM | POA: Diagnosis not present

## 2016-04-30 DIAGNOSIS — I10 Essential (primary) hypertension: Secondary | ICD-10-CM | POA: Diagnosis not present

## 2016-04-30 DIAGNOSIS — M791 Myalgia: Secondary | ICD-10-CM | POA: Diagnosis not present

## 2016-04-30 DIAGNOSIS — E041 Nontoxic single thyroid nodule: Secondary | ICD-10-CM | POA: Diagnosis not present

## 2016-04-30 DIAGNOSIS — M542 Cervicalgia: Secondary | ICD-10-CM | POA: Diagnosis not present

## 2016-05-01 DIAGNOSIS — M65872 Other synovitis and tenosynovitis, left ankle and foot: Secondary | ICD-10-CM | POA: Diagnosis not present

## 2016-05-02 DIAGNOSIS — S0990XA Unspecified injury of head, initial encounter: Secondary | ICD-10-CM | POA: Diagnosis not present

## 2016-05-02 DIAGNOSIS — R0781 Pleurodynia: Secondary | ICD-10-CM | POA: Diagnosis not present

## 2016-05-02 DIAGNOSIS — S20219A Contusion of unspecified front wall of thorax, initial encounter: Secondary | ICD-10-CM | POA: Diagnosis not present

## 2016-05-02 DIAGNOSIS — S00432A Contusion of left ear, initial encounter: Secondary | ICD-10-CM | POA: Diagnosis not present

## 2016-05-07 DIAGNOSIS — R0781 Pleurodynia: Secondary | ICD-10-CM | POA: Diagnosis not present

## 2016-05-07 DIAGNOSIS — H9202 Otalgia, left ear: Secondary | ICD-10-CM | POA: Diagnosis not present

## 2016-05-07 DIAGNOSIS — W19XXXD Unspecified fall, subsequent encounter: Secondary | ICD-10-CM | POA: Diagnosis not present

## 2016-05-27 DIAGNOSIS — H9201 Otalgia, right ear: Secondary | ICD-10-CM | POA: Diagnosis not present

## 2016-06-19 DIAGNOSIS — D692 Other nonthrombocytopenic purpura: Secondary | ICD-10-CM | POA: Diagnosis not present

## 2016-06-19 DIAGNOSIS — B351 Tinea unguium: Secondary | ICD-10-CM | POA: Diagnosis not present

## 2016-06-19 DIAGNOSIS — D225 Melanocytic nevi of trunk: Secondary | ICD-10-CM | POA: Diagnosis not present

## 2016-06-19 DIAGNOSIS — D2262 Melanocytic nevi of left upper limb, including shoulder: Secondary | ICD-10-CM | POA: Diagnosis not present

## 2016-06-19 DIAGNOSIS — D1801 Hemangioma of skin and subcutaneous tissue: Secondary | ICD-10-CM | POA: Diagnosis not present

## 2016-06-19 DIAGNOSIS — L812 Freckles: Secondary | ICD-10-CM | POA: Diagnosis not present

## 2016-06-19 DIAGNOSIS — B353 Tinea pedis: Secondary | ICD-10-CM | POA: Diagnosis not present

## 2016-06-19 DIAGNOSIS — L821 Other seborrheic keratosis: Secondary | ICD-10-CM | POA: Diagnosis not present

## 2016-06-19 DIAGNOSIS — L723 Sebaceous cyst: Secondary | ICD-10-CM | POA: Diagnosis not present

## 2016-06-19 DIAGNOSIS — I83893 Varicose veins of bilateral lower extremities with other complications: Secondary | ICD-10-CM | POA: Diagnosis not present

## 2016-06-19 DIAGNOSIS — D2271 Melanocytic nevi of right lower limb, including hip: Secondary | ICD-10-CM | POA: Diagnosis not present

## 2016-06-19 DIAGNOSIS — D2261 Melanocytic nevi of right upper limb, including shoulder: Secondary | ICD-10-CM | POA: Diagnosis not present

## 2016-06-28 DIAGNOSIS — M2550 Pain in unspecified joint: Secondary | ICD-10-CM | POA: Diagnosis not present

## 2016-06-28 DIAGNOSIS — R51 Headache: Secondary | ICD-10-CM | POA: Diagnosis not present

## 2016-07-15 DIAGNOSIS — Z1231 Encounter for screening mammogram for malignant neoplasm of breast: Secondary | ICD-10-CM | POA: Diagnosis not present

## 2016-07-29 DIAGNOSIS — M810 Age-related osteoporosis without current pathological fracture: Secondary | ICD-10-CM | POA: Diagnosis not present

## 2016-07-29 DIAGNOSIS — E7800 Pure hypercholesterolemia, unspecified: Secondary | ICD-10-CM | POA: Diagnosis not present

## 2016-07-29 DIAGNOSIS — Z79899 Other long term (current) drug therapy: Secondary | ICD-10-CM | POA: Diagnosis not present

## 2016-07-29 DIAGNOSIS — I10 Essential (primary) hypertension: Secondary | ICD-10-CM | POA: Diagnosis not present

## 2016-07-29 DIAGNOSIS — Z7982 Long term (current) use of aspirin: Secondary | ICD-10-CM | POA: Diagnosis not present

## 2016-07-29 LAB — BASIC METABOLIC PANEL
BUN: 19 (ref 4–21)
Creatinine: 0.7 (ref 0.5–1.1)
GLUCOSE: 136
POTASSIUM: 5 (ref 3.4–5.3)
SODIUM: 140 (ref 137–147)

## 2016-07-29 LAB — LIPID PANEL
CHOLESTEROL: 125 (ref 0–200)
HDL: 71 — AB (ref 35–70)
LDL Cholesterol: 38
LDL/HDL RATIO: 1.8
Triglycerides: 80 (ref 40–160)

## 2016-07-29 LAB — CBC AND DIFFERENTIAL
HEMATOCRIT: 39 (ref 36–46)
Hemoglobin: 12.9 (ref 12.0–16.0)
NEUTROS ABS: 4
PLATELETS: 308 (ref 150–399)
WBC: 6.4

## 2016-07-29 LAB — HEPATIC FUNCTION PANEL
ALT: 25 (ref 7–35)
AST: 20 (ref 13–35)
Alkaline Phosphatase: 73 (ref 25–125)
Bilirubin, Total: 0.3

## 2016-07-29 LAB — VITAMIN D 25 HYDROXY (VIT D DEFICIENCY, FRACTURES): Vit D, 25-Hydroxy: 25

## 2016-07-29 LAB — TSH: TSH: 3.33 (ref 0.41–5.90)

## 2016-08-05 DIAGNOSIS — E7800 Pure hypercholesterolemia, unspecified: Secondary | ICD-10-CM | POA: Diagnosis not present

## 2016-08-05 DIAGNOSIS — I10 Essential (primary) hypertension: Secondary | ICD-10-CM | POA: Diagnosis not present

## 2016-08-05 DIAGNOSIS — R001 Bradycardia, unspecified: Secondary | ICD-10-CM | POA: Diagnosis not present

## 2016-08-05 DIAGNOSIS — R51 Headache: Secondary | ICD-10-CM | POA: Diagnosis not present

## 2016-08-05 DIAGNOSIS — M810 Age-related osteoporosis without current pathological fracture: Secondary | ICD-10-CM | POA: Diagnosis not present

## 2016-08-05 DIAGNOSIS — K589 Irritable bowel syndrome without diarrhea: Secondary | ICD-10-CM | POA: Diagnosis not present

## 2016-08-14 DIAGNOSIS — H35363 Drusen (degenerative) of macula, bilateral: Secondary | ICD-10-CM | POA: Diagnosis not present

## 2016-08-14 DIAGNOSIS — D2311 Other benign neoplasm of skin of right eyelid, including canthus: Secondary | ICD-10-CM | POA: Diagnosis not present

## 2016-08-14 DIAGNOSIS — H04123 Dry eye syndrome of bilateral lacrimal glands: Secondary | ICD-10-CM | POA: Diagnosis not present

## 2016-08-14 DIAGNOSIS — H35373 Puckering of macula, bilateral: Secondary | ICD-10-CM | POA: Diagnosis not present

## 2016-09-03 DIAGNOSIS — M79641 Pain in right hand: Secondary | ICD-10-CM | POA: Diagnosis not present

## 2016-09-03 DIAGNOSIS — M19049 Primary osteoarthritis, unspecified hand: Secondary | ICD-10-CM | POA: Diagnosis not present

## 2016-09-03 DIAGNOSIS — M67441 Ganglion, right hand: Secondary | ICD-10-CM | POA: Diagnosis not present

## 2016-10-10 DIAGNOSIS — M2550 Pain in unspecified joint: Secondary | ICD-10-CM | POA: Diagnosis not present

## 2016-10-10 DIAGNOSIS — M7989 Other specified soft tissue disorders: Secondary | ICD-10-CM | POA: Diagnosis not present

## 2016-10-10 DIAGNOSIS — E663 Overweight: Secondary | ICD-10-CM | POA: Diagnosis not present

## 2016-10-10 DIAGNOSIS — Z6826 Body mass index (BMI) 26.0-26.9, adult: Secondary | ICD-10-CM | POA: Diagnosis not present

## 2016-10-10 DIAGNOSIS — H04123 Dry eye syndrome of bilateral lacrimal glands: Secondary | ICD-10-CM | POA: Diagnosis not present

## 2016-10-17 ENCOUNTER — Ambulatory Visit (INDEPENDENT_AMBULATORY_CARE_PROVIDER_SITE_OTHER): Payer: Medicare Other | Admitting: Family Medicine

## 2016-10-17 ENCOUNTER — Encounter: Payer: Self-pay | Admitting: Family Medicine

## 2016-10-17 VITALS — BP 112/76 | HR 78 | Temp 98.2°F | Resp 16 | Ht 65.0 in | Wt 156.4 lb

## 2016-10-17 DIAGNOSIS — M79672 Pain in left foot: Secondary | ICD-10-CM | POA: Diagnosis not present

## 2016-10-17 NOTE — Patient Instructions (Signed)
Follow up as needed/scheduled Your foot and ankle pain is most likely related to your Rheumatoid arthritis I'll send a copy of today's note to Dr Estelle Grumbles and elevate the foot/ankle for pain relief Continue to wear good supportive shoes Call with any questions or concerns Hang in there!!!

## 2016-10-17 NOTE — Progress Notes (Signed)
   Subjective:    Patient ID: Jennifer Boone, female    DOB: [birth year], 83 y.o.   MRN: [id number]  HPI Foot pain- pt reports sxs started in January and she saw a podiatrist at that time.  She was supposed to f/u but never made it back after sustaining a fall and breaking her ribs.  Pt was just dx'd w/ RA- now on 5mg  prednisone daily.  Pt is now seeing [facility] and is to go back in 2 weeks.  Pain is L sided, across top of foot but will ocsasionally have pain on bottom of foot and up into ankle.  Pt often wears sneakers w/ orthotics- does a lot of walking.   Review of Systems For ROS see HPI     Objective:   Physical Exam  Constitutional: She is oriented to person, place, and time. She appears well-developed and well-nourished. No distress.  Cardiovascular: Intact distal pulses.   Musculoskeletal: She exhibits tenderness (no TTP over ankle mortis or medial malleolus.  mild TTP over lateral malleolus). She exhibits no edema or deformity.  Neurological: She is alert and oriented to person, place, and time.  Skin: Skin is warm and dry. No rash noted. No erythema.  Psychiatric: She has a normal mood and affect. Her behavior is normal. Thought content normal.  Vitals reviewed.         Assessment & Plan:  Foot pain- suspect this is due to pt's RA.  She is currently on prednisone and undergoing a work up w/ Dr Melissa Noon office.  Strongly encouraged her to discuss this w/ him at her return visit rather than me adding medication and/or referring to podiatry at this time.  Pt expressed understanding and is in agreement w/ plan.

## 2016-10-17 NOTE — Progress Notes (Signed)
Pre visit review using our clinic review tool, if applicable. No additional management support is needed unless otherwise documented below in the visit note. 

## 2016-10-28 ENCOUNTER — Other Ambulatory Visit: Payer: Self-pay | Admitting: Physician Assistant

## 2016-10-28 ENCOUNTER — Ambulatory Visit (INDEPENDENT_AMBULATORY_CARE_PROVIDER_SITE_OTHER): Payer: Medicare Other | Admitting: Physician Assistant

## 2016-10-28 ENCOUNTER — Encounter: Payer: Self-pay | Admitting: Physician Assistant

## 2016-10-28 VITALS — BP 122/70 | HR 64 | Temp 98.3°F | Resp 14 | Ht 65.0 in | Wt 153.0 lb

## 2016-10-28 DIAGNOSIS — K219 Gastro-esophageal reflux disease without esophagitis: Secondary | ICD-10-CM | POA: Diagnosis not present

## 2016-10-28 DIAGNOSIS — M898X1 Other specified disorders of bone, shoulder: Secondary | ICD-10-CM | POA: Diagnosis not present

## 2016-10-28 LAB — COMPREHENSIVE METABOLIC PANEL
ALBUMIN: 4.2 g/dL (ref 3.6–5.1)
ALT: 22 U/L (ref 6–29)
AST: 18 U/L (ref 10–35)
Alkaline Phosphatase: 70 U/L (ref 33–130)
BUN: 14 mg/dL (ref 7–25)
CHLORIDE: 100 mmol/L (ref 98–110)
CO2: 24 mmol/L (ref 20–31)
CREATININE: 0.69 mg/dL (ref 0.60–0.88)
Calcium: 9.3 mg/dL (ref 8.6–10.4)
Glucose, Bld: 98 mg/dL (ref 65–99)
POTASSIUM: 4.5 mmol/L (ref 3.5–5.3)
SODIUM: 135 mmol/L (ref 135–146)
Total Bilirubin: 0.7 mg/dL (ref 0.2–1.2)
Total Protein: 6.7 g/dL (ref 6.1–8.1)

## 2016-10-28 LAB — TROPONIN I

## 2016-10-28 LAB — LIPASE: LIPASE: 14 U/L (ref 7–60)

## 2016-10-28 MED ORDER — RANITIDINE HCL 150 MG PO TABS: 150.0000 mg | ORAL_TABLET | Freq: Two times a day (BID) | ORAL | 0 refills | Status: DC

## 2016-10-28 NOTE — Progress Notes (Signed)
Patient presents to clinic today c/o episode of R posterior shoulder pain and acid reflux. Patient endorses history of GERD, currently on Prilosec twice daily. Has noted increased reflux with gaseousness over the past few days. Does note eating heavier, tomato-based meals in the past week. Notes symptoms are present despite Prilosec daily. Denies epigastric pain, nausea or vomiting. Has also noted pain in R shoulder blade starting last night after dinner. Was associated with reflux and bloating so she felt that it was gas related. Took some gas-x and laid down with resolution of symptoms. Noted feeling fine today until about 9 AM when she noted a recurrence of the pain. Today is aggravated by positioning. Husband notes that patient is very active and has started doing leg presses at the gym a couple of days before symptom onset. Notes to do these exercises she lays on a metal board with legs in the air, pushing up against weights with her legs/feet. Also does cardio several times per week. Patient denies chest pain, palpitations, lightheadedness, dizziness, vision changes or frequent headaches.   Past Medical History:  Diagnosis Date  . Arrhythmia   . Arthritis   . Hyperlipidemia   . Hypertension   . IBS (irritable bowel syndrome)   . 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 aspirin 81 MG tablet Take 81 mg by mouth daily.      . Coenzyme Q10 (COQ-10) 100 MG CAPS Take by mouth.    . cyclobenzaprine (FLEXERIL) 10 MG tablet 1/2-1 tab q8 hrs as needed for muscle spasm 30 tablet 0  . FIBER COMPLETE PO Take by mouth.    Marland Kitchen glucosamine-chondroitin 500-400 MG tablet Take 1 tablet by mouth 3 (three) times daily.      . Multiple Vitamins-Minerals (PRESERVISION AREDS 2 PO) Take by mouth.    Marland Kitchen omeprazole (PRILOSEC) 20 MG capsule Take 20 mg by mouth daily.      . predniSONE (DELTASONE) 5 MG tablet Take 5 mg by mouth  daily with breakfast.    . propranolol (INDERAL) 10 MG tablet Take 10 mg by mouth 3 (three) times daily.    . simvastatin (ZOCOR) 40 MG tablet Take 40 mg by mouth at bedtime.      . SUMAtriptan (IMITREX) 50 MG tablet Take 50 mg by mouth every 2 (two) hours as needed for migraine. May repeat in 2 hours if headache persists or recurs.     No current facility-administered medications on file prior to visit.     Allergies  Allergen Reactions  . Celebrex [Celecoxib]     Hives, throat closes  . Tape     Hives, cannot tolerate band-aids.   . Amoxicillin     REACTION: hives  . Cefuroxime Axetil Hives  . Clindamycin     REACTION: hives  . Codeine     Cannot remember, bad reaction  . Tetracycline     REACTION: GI upset    Family History  Problem Relation Age of Onset  . Heart disease Mother   . Diabetes Sister   . Heart disease Sister     Social History   Social History  . Marital status: Married    Spouse name: N/A  . Number of children: N/A  . Years of education: N/A   Social History Main Topics  . Smoking status: Former Research scientist (life sciences)  . Smokeless tobacco: Never Used  .  Alcohol use No  . Drug use: No  . Sexual activity: No   Other Topics Concern  . None   Social History Narrative  . None   Review of Systems - See HPI.  All other ROS are negative.  BP 122/70   Pulse 64   Temp 98.3 F (36.8 C) (Oral)   Resp 14   Ht 5' 5"  (1.651 m)   Wt 153 lb (69.4 kg)   SpO2 97%   BMI 25.46 kg/m   Physical Exam  Constitutional: She is oriented to person, place, and time and well-developed, well-nourished, and in no distress.  HENT:  Head: Normocephalic and atraumatic.  Eyes: Conjunctivae are normal.  Neck: Neck supple.  Cardiovascular: Normal rate, regular rhythm, normal heart sounds and intact distal pulses.   Pulmonary/Chest: Effort normal and breath sounds normal. No respiratory distress. She has no wheezes. She has no rales. She exhibits no tenderness.  Abdominal: Soft.  Bowel sounds are normal. She exhibits no distension and no mass. There is no tenderness. There is no rebound and no guarding.  Musculoskeletal:       Back:  Neurological: She is alert and oriented to person, place, and time.  Skin: Skin is warm and dry.  Psychiatric: Affect normal.  Vitals reviewed.  Assessment/Plan: 1. Gastroesophageal reflux disease without esophagitis Obtained due to the shoulder blade pain. EKG with sinus bradycardia. Some mild QRS change only in v3. Cardiology consulted and felt this was not a substantial or worrisome finding. Will check lipase, CMP and Troponin today to r/o other cause of symptoms. Continue Prilosec daily. Start Zantac BID. Probiotic discussed. Begin GERD diet. Follow-up scheduled. Alarm signs/symptoms reviewed with patient.  - EKG 12-Lead - Lipase - Comp Met (CMET) - Troponin I  2. Shoulder blade pain EKG unremarkable as noted above. Seems positional and there is some reproduction with palpation although none with ROM. Will stop resistance training at the gym over the week. Supportive measures and OTC medications reviewed. Follow-up scheduled.   Leeanne Rio, PA-C

## 2016-10-28 NOTE — Patient Instructions (Signed)
Please continue Prilosec once daily. Start the Zantax twice daily as directed. Increase fluids. Avoid late-night eating and spicy foods. Follow-up in 1 week. Return sooner if symptoms are not resolving.  If you note any worsening symptoms or chest pain, racing heart, etc, please go to the ER.

## 2016-10-28 NOTE — Progress Notes (Signed)
Pre visit review using our clinic review tool, if applicable. No additional management support is needed unless otherwise documented below in the visit note. 

## 2016-11-04 ENCOUNTER — Other Ambulatory Visit: Payer: Self-pay | Admitting: Physician Assistant

## 2016-11-04 ENCOUNTER — Encounter: Payer: Self-pay | Admitting: Physician Assistant

## 2016-11-04 ENCOUNTER — Ambulatory Visit (INDEPENDENT_AMBULATORY_CARE_PROVIDER_SITE_OTHER): Payer: Medicare Other | Admitting: Physician Assistant

## 2016-11-04 VITALS — BP 112/64 | HR 59 | Temp 97.9°F | Resp 14 | Ht 65.0 in | Wt 153.0 lb

## 2016-11-04 DIAGNOSIS — B351 Tinea unguium: Secondary | ICD-10-CM | POA: Diagnosis not present

## 2016-11-04 DIAGNOSIS — K2100 Gastro-esophageal reflux disease with esophagitis, without bleeding: Secondary | ICD-10-CM

## 2016-11-04 MED ORDER — EFINACONAZOLE 10 % EX SOLN: CUTANEOUS | 0 refills | Status: DC

## 2016-11-04 NOTE — Progress Notes (Signed)
Pre visit review using our clinic review tool, if applicable. No additional management support is needed unless otherwise documented below in the visit note. 

## 2016-11-04 NOTE — Patient Instructions (Signed)
Please continue the Zantac as directed over the next week along with the Prilosec. Start a daily probiotic as well. This will help with gaseousness and digestion. Follow-up in 2 weeks for reassessment with PCP.

## 2016-11-04 NOTE — Progress Notes (Addendum)
Patient presents to clinic today for follow-up of GERD with esophageal inflammation causing mild thoracic back pain. Workup at last visit unremarkable. Started on Zantac BID in addition to her daily PPI. Has taken as directed along with following a GERD diet. Has noted significant improvement in symptoms. Denies epigastric pain, back pain, reflux, nausea/vomiting or abdominal pain. Stools are consistent. Does note still having significant burping.   Past Medical History:  Diagnosis Date  . Arrhythmia   . Arthritis   . Hyperlipidemia   . Hypertension   . IBS (irritable bowel syndrome)   . 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 aspirin 81 MG tablet Take 81 mg by mouth daily.      . Coenzyme Q10 (COQ-10) 100 MG CAPS Take by mouth.    . cyclobenzaprine (FLEXERIL) 10 MG tablet 1/2-1 tab q8 hrs as needed for muscle spasm 30 tablet 0  . FIBER COMPLETE PO Take by mouth.    Marland Kitchen glucosamine-chondroitin 500-400 MG tablet Take 1 tablet by mouth 3 (three) times daily.      . Multiple Vitamins-Minerals (PRESERVISION AREDS 2 PO) Take by mouth.    Marland Kitchen omeprazole (PRILOSEC) 20 MG capsule Take 20 mg by mouth daily.      . predniSONE (DELTASONE) 5 MG tablet Take 5 mg by mouth daily with breakfast.    . propranolol (INDERAL) 10 MG tablet Take 10 mg by mouth 3 (three) times daily.    . ranitidine (ZANTAC) 150 MG tablet TAKE 1 TABLET(150 MG) BY MOUTH TWICE DAILY 180 tablet 0  . simvastatin (ZOCOR) 40 MG tablet Take 40 mg by mouth at bedtime.      . SUMAtriptan (IMITREX) 50 MG tablet Take 50 mg by mouth every 2 (two) hours as needed for migraine. May repeat in 2 hours if headache persists or recurs.     No current facility-administered medications on file prior to visit.     Allergies  Allergen Reactions  . Celebrex [Celecoxib]     Hives, throat closes  . Tape     Hives, cannot tolerate band-aids.   . Amoxicillin      REACTION: hives  . Cefuroxime Axetil Hives  . Clindamycin     REACTION: hives  . Codeine     Cannot remember, bad reaction  . Tetracycline     REACTION: GI upset    Family History  Problem Relation Age of Onset  . Heart disease Mother   . Diabetes Sister   . Heart disease Sister     Social History   Social History  . Marital status: Married    Spouse name: N/A  . Number of children: N/A  . Years of education: N/A   Social History Main Topics  . Smoking status: Former Research scientist (life sciences)  . Smokeless tobacco: Never Used  . Alcohol use No  . Drug use: No  . Sexual activity: No   Other Topics Concern  . None   Social History Narrative  . None   Review of Systems - See HPI.  All other ROS are negative.  BP 112/64   Pulse (!) 59   Temp 97.9 F (36.6 C) (Oral)   Resp 14   Ht _0  (1.651 m)   Wt 153 lb (69.4 kg)   SpO2 96%   BMI 25.46 kg/m   Physical Exam  Constitutional: She is oriented to person,  place, and time and well-developed, well-nourished, and in no distress.  HENT:  Head: Normocephalic and atraumatic.  Eyes: Conjunctivae are normal.  Cardiovascular: Normal rate, regular rhythm, normal heart sounds and intact distal pulses.   Pulmonary/Chest: Effort normal and breath sounds normal. No respiratory distress. She has no wheezes. She has no rales. She exhibits no tenderness.  Abdominal: Soft. Bowel sounds are normal. She exhibits no distension and no mass. There is no tenderness. There is no rebound and no guarding.  Neurological: She is alert and oriented to person, place, and time.  Skin: Skin is warm and dry. No rash noted.  Psychiatric: Affect normal.    Recent Results (from the past 2160 hour(s))  Lipase     Status: None   Collection Time: 10/28/16  4:54 PM  Result Value Ref Range   Lipase 14 7 - 60 U/L  Comp Met (CMET)     Status: None   Collection Time: 10/28/16  4:54 PM  Result Value Ref Range   Sodium 135 135 - 146 mmol/L   Potassium 4.5 3.5 -  5.3 mmol/L   Chloride 100 98 - 110 mmol/L   CO2 24 20 - 31 mmol/L   Glucose, Bld 98 65 - 99 mg/dL   BUN 14 7 - 25 mg/dL   Creat 0.69 0.60 - 0.88 mg/dL    Comment:   For patients > or = 81 years of age: The upper reference limit for Creatinine is approximately 13% higher for people identified as African-American.      Total Bilirubin 0.7 0.2 - 1.2 mg/dL   Alkaline Phosphatase 70 33 - 130 U/L   AST 18 10 - 35 U/L   ALT 22 6 - 29 U/L   Total Protein 6.7 6.1 - 8.1 g/dL   Albumin 4.2 3.6 - 5.1 g/dL   Calcium 9.3 8.6 - 10.4 mg/dL  Troponin I     Status: None   Collection Time: 10/28/16  4:54 PM  Result Value Ref Range   Troponin I <0.01 <=0.05 ng/mL    Comment:   In accord with published recommendations, serial testing of Troponin I at intervals of 2 to 4 hours for up to 12 to 24 hours is suggested in order to corroborate a single Troponin I result. An elevated troponin alone is not sufficient to make the diagnosis of MI. ** Please note change in reference range(s). **      Assessment/Plan: 1. Gastroesophageal reflux disease with esophagitis Esophagitis resolved. Still mild GI upset. Start probiotic. Will continue Zantac as directed for additional two weeks along with PPI. Follow-up at that time to discuss wean. Return immediately for any worsening symptoms.  2. Onychomycosis Patient requesting script at the end of visit. Long-standing history of onychomycosis, previously followed by Dermatology. Has been on Ciclopirox without improvement. Would like to discuss other options. Discussed topical versus oral therapy. Will attempt trial of Jublia (informed patient prior Josem Kaufmann may be needed). If not working or not affordable, will refer to Podiatry for further discussion of oral treatment.    Leeanne Rio, PA-C

## 2016-11-04 NOTE — Addendum Note (Signed)
Addended by: Brunetta Jeans on: 11/04/2016 02:28 PM   Modules accepted: Orders, Level of Service

## 2016-11-06 ENCOUNTER — Telehealth: Payer: Self-pay | Admitting: *Deleted

## 2016-11-06 NOTE — Telephone Encounter (Signed)
PA Started through [employer] [facility]

## 2016-11-07 DIAGNOSIS — H04123 Dry eye syndrome of bilateral lacrimal glands: Secondary | ICD-10-CM | POA: Diagnosis not present

## 2016-11-07 DIAGNOSIS — E663 Overweight: Secondary | ICD-10-CM | POA: Diagnosis not present

## 2016-11-07 DIAGNOSIS — M06042 Rheumatoid arthritis without rheumatoid factor, left hand: Secondary | ICD-10-CM | POA: Diagnosis not present

## 2016-11-07 DIAGNOSIS — Z6825 Body mass index (BMI) 25.0-25.9, adult: Secondary | ICD-10-CM | POA: Diagnosis not present

## 2016-11-07 DIAGNOSIS — M2550 Pain in unspecified joint: Secondary | ICD-10-CM | POA: Diagnosis not present

## 2016-11-07 DIAGNOSIS — M06041 Rheumatoid arthritis without rheumatoid factor, right hand: Secondary | ICD-10-CM | POA: Diagnosis not present

## 2016-11-18 ENCOUNTER — Ambulatory Visit (INDEPENDENT_AMBULATORY_CARE_PROVIDER_SITE_OTHER): Payer: Medicare Other | Admitting: Physician Assistant

## 2016-11-18 ENCOUNTER — Encounter: Payer: Self-pay | Admitting: Physician Assistant

## 2016-11-18 VITALS — BP 120/72 | HR 59 | Temp 97.9°F | Resp 14 | Ht 65.0 in | Wt 154.0 lb

## 2016-11-18 DIAGNOSIS — R252 Cramp and spasm: Secondary | ICD-10-CM | POA: Diagnosis not present

## 2016-11-18 DIAGNOSIS — M26621 Arthralgia of right temporomandibular joint: Secondary | ICD-10-CM

## 2016-11-18 DIAGNOSIS — K219 Gastro-esophageal reflux disease without esophagitis: Secondary | ICD-10-CM | POA: Diagnosis not present

## 2016-11-18 DIAGNOSIS — M546 Pain in thoracic spine: Secondary | ICD-10-CM

## 2016-11-18 LAB — COMPREHENSIVE METABOLIC PANEL
ALK PHOS: 66 U/L (ref 39–117)
ALT: 16 U/L (ref 0–35)
AST: 15 U/L (ref 0–37)
Albumin: 4.2 g/dL (ref 3.5–5.2)
BUN: 11 mg/dL (ref 6–23)
CHLORIDE: 99 meq/L (ref 96–112)
CO2: 30 mEq/L (ref 19–32)
Calcium: 9.7 mg/dL (ref 8.4–10.5)
Creatinine, Ser: 0.71 mg/dL (ref 0.40–1.20)
GFR: 84 mL/min (ref >60.00)
GLUCOSE: 117 mg/dL — AB (ref 70–99)
POTASSIUM: 4.3 meq/L (ref 3.5–5.1)
Sodium: 137 mEq/L (ref 135–145)
TOTAL PROTEIN: 6.3 g/dL (ref 6.0–8.3)
Total Bilirubin: 0.4 mg/dL (ref 0.2–1.2)

## 2016-11-18 LAB — H. PYLORI ANTIBODY, IGG: H PYLORI IGG: NEGATIVE

## 2016-11-18 LAB — MAGNESIUM: MAGNESIUM: 2 mg/dL (ref 1.5–2.5)

## 2016-11-18 NOTE — Progress Notes (Signed)
Patient presents to clinic today c/o for follow-up of back pain and GERD symptoms. Patient also with acute concerns today.  GERD -- Patent notes resolution of heart burn with medications. Still having some bloating and increased gaseousness despite medications, probiotic and following GERD diet. Denies epigastric pain, abdominal pain, nausea or vomiting.   Back Pain -- Much improved. Is still having occasional episodes of aching back pain in the R thoracic region. Is only occurring when in a seated position and is alleviated over time with ambulating or laying down. Pain may last a few minutes or several hours. Again is very active and notes symptoms improve with activity. Again denies chest pain or SOB. Has been taking medications as directed but recurrences of pain are still occurring.   Patient complains of 1 week of intermittent right jaw and ear pain described as a throbbing and aching in nature.  Is not radiating. Notes is worse with chewing. Denies ringing in tear or changed hearing. Denis drainage from the ear. Denies symptoms of left ear. Denies any not dental pain. Has ned some cracking with moving her jaw.  Has not tried anything for symptoms. Denies any pain at present.   Patient also notes several months of intermittent cramping of left lower extremity. Is staying very active. Denies trauma or injury. Denies swelling or bruising. Endorses trying to stay hydrated but states she could do better.   Past Medical History:  Diagnosis Date  . Arrhythmia   . Arthritis   . Hyperlipidemia   . Hypertension   . IBS (irritable bowel syndrome)   . 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 aspirin 81 MG tablet Take 81 mg by mouth daily.      . Coenzyme Q10 (COQ-10) 100 MG CAPS Take by mouth.    . cyclobenzaprine (FLEXERIL) 10 MG tablet 1/2-1 tab q8 hrs as needed for muscle spasm 30 tablet 0  .  Efinaconazole 10 % SOLN 1 drop to affected nail daily. 4 mL 0  . FIBER COMPLETE PO Take by mouth.    Marland Kitchen glucosamine-chondroitin 500-400 MG tablet Take 1 tablet by mouth 3 (three) times daily.      . Multiple Vitamins-Minerals (PRESERVISION AREDS 2 PO) Take by mouth.    Marland Kitchen omeprazole (PRILOSEC) 20 MG capsule Take 20 mg by mouth daily.      . predniSONE (DELTASONE) 5 MG tablet Take 5 mg by mouth daily with breakfast.    . propranolol (INDERAL) 10 MG tablet Take 10 mg by mouth 3 (three) times daily.    . ranitidine (ZANTAC) 150 MG tablet TAKE 1 TABLET(150 MG) BY MOUTH TWICE DAILY 180 tablet 0  . simvastatin (ZOCOR) 40 MG tablet Take 40 mg by mouth at bedtime.      . SUMAtriptan (IMITREX) 50 MG tablet Take 50 mg by mouth every 2 (two) hours as needed for migraine. May repeat in 2 hours if headache persists or recurs.     No current facility-administered medications on file prior to visit.     Allergies  Allergen Reactions  . Celebrex [Celecoxib]     Hives, throat closes  . Tape     Hives, cannot tolerate band-aids.   . Amoxicillin     REACTION: hives  . Cefuroxime Axetil Hives  . Clindamycin     REACTION: hives  . Codeine     Cannot remember, bad  reaction  . Tetracycline     REACTION: GI upset    Family History  Problem Relation Age of Onset  . Heart disease Mother   . Diabetes Sister   . Heart disease Sister     Social History   Social History  . Marital status: Married    Spouse name: N/A  . Number of children: N/A  . Years of education: N/A   Social History Main Topics  . Smoking status: Former Research scientist (life sciences)  . Smokeless tobacco: Never Used  . Alcohol use No  . Drug use: No  . Sexual activity: No   Other Topics Concern  . None   Social History Narrative  . None   Review of Systems - See HPI.  All other ROS are negative.  BP 120/72   Pulse (!) 59   Temp 97.9 F (36.6 C) (Oral)   Resp 14   Ht _0  (1.651 m)   Wt 154 lb (69.9 kg)   SpO2 96%   BMI 25.63 kg/m     Physical Exam  Constitutional: She is oriented to person, place, and time and well-developed, well-nourished, and in no distress.  HENT:  Head: Normocephalic and atraumatic.  Eyes: Conjunctivae are normal.  Neck: Neck supple.  Cardiovascular: Normal rate, regular rhythm, normal heart sounds and intact distal pulses.   Pulmonary/Chest: Effort normal and breath sounds normal. No respiratory distress. She has no wheezes. She has no rales. She exhibits no tenderness.  Musculoskeletal:       Right ankle: Normal.       Left ankle: Normal.       Cervical back: Normal.       Thoracic back: Normal.       Right lower leg: Normal.       Left lower leg: Normal.  Neurological: She is alert and oriented to person, place, and time.  Skin: Skin is warm and dry. No rash noted.  Psychiatric: Affect normal.  Vitals reviewed.  Recent Results (from the past 2160 hour(s))  Lipase     Status: None   Collection Time: 10/28/16  4:54 PM  Result Value Ref Range   Lipase 14 7 - 60 U/L  Comp Met (CMET)     Status: None   Collection Time: 10/28/16  4:54 PM  Result Value Ref Range   Sodium 135 135 - 146 mmol/L   Potassium 4.5 3.5 - 5.3 mmol/L   Chloride 100 98 - 110 mmol/L   CO2 24 20 - 31 mmol/L   Glucose, Bld 98 65 - 99 mg/dL   BUN 14 7 - 25 mg/dL   Creat 0.69 0.60 - 0.88 mg/dL    Comment:   For patients > or = 81 years of age: The upper reference limit for Creatinine is approximately 13% higher for people identified as African-American.      Total Bilirubin 0.7 0.2 - 1.2 mg/dL   Alkaline Phosphatase 70 33 - 130 U/L   AST 18 10 - 35 U/L   ALT 22 6 - 29 U/L   Total Protein 6.7 6.1 - 8.1 g/dL   Albumin 4.2 3.6 - 5.1 g/dL   Calcium 9.3 8.6 - 10.4 mg/dL  Troponin I     Status: None   Collection Time: 10/28/16  4:54 PM  Result Value Ref Range   Troponin I <0.01 <=0.05 ng/mL    Comment:   In accord with published recommendations, serial testing of Troponin I at intervals of 2 to 4  hours for up  to 12 to 24 hours is suggested in order to corroborate a single Troponin I result. An elevated troponin alone is not sufficient to make the diagnosis of MI. ** Please note change in reference range(s). **     Magnesium     Status: None   Collection Time: 11/18/16  9:37 AM  Result Value Ref Range   Magnesium 2.0 1.5 - 2.5 mg/dL  H. pylori antibody, IgG     Status: None   Collection Time: 11/18/16  9:37 AM  Result Value Ref Range   H Pylori IgG Negative Negative  Comp Met (CMET)     Status: Abnormal   Collection Time: 11/18/16  9:37 AM  Result Value Ref Range   Sodium 137 135 - 145 mEq/L   Potassium 4.3 3.5 - 5.1 mEq/L   Chloride 99 96 - 112 mEq/L   CO2 30 19 - 32 mEq/L   Glucose, Bld 117 (H) 70 - 99 mg/dL   BUN 11 6 - 23 mg/dL   Creatinine, Ser 0.71 0.40 - 1.20 mg/dL   Total Bilirubin 0.4 0.2 - 1.2 mg/dL   Alkaline Phosphatase 66 39 - 117 U/L   AST 15 0 - 37 U/L   ALT 16 0 - 35 U/L   Total Protein 6.3 6.0 - 8.3 g/dL   Albumin 4.2 3.5 - 5.2 g/dL   Calcium 9.7 8.4 - 10.5 mg/dL   GFR 84.00 >60.00 mL/min    Assessment/Plan: Subacute thoracic back pain Occurring with prolonged sitting and alleviated with ambulation. Not consistent with Cardiac etiology. Originally also concerned for a GERD component. This is better with change in medication. Recommend Sports medicine assessment. Referral placed.   Muscle cramps Will check electrolytes today to include magnesium.  Stretching prior to activity recommended. Proper hydration reviewed.   GERD Will check h. Pylori today. If negative, recommend GI assessment. Continue current medication regimen.   Arthralgia of right temporomandibular joint Discussed avoidance of chewing on affected side.  Topical Salon Pas. Ice. X-rays if not improving.      Leeanne Rio, PA-C

## 2016-11-18 NOTE — Progress Notes (Signed)
Pre visit review using our clinic review tool, if applicable. No additional management support is needed unless otherwise documented below in the visit note. 

## 2016-11-18 NOTE — Patient Instructions (Addendum)
Please stay well-hydrated and get plenty of rest.  Start a small G2 gatorade before your daily walks. I am checking magnesium level at present.   Continue Prilosec and probiotic. I am checking an h. Pylori level today. If positive, we will start treatment. If negative, will recommend Gastroenterology assessment.  I am setting you up with Dr. Paulla Fore for further assessment of this ongoing back pain. Start 2 tylenol extra strength twice daily. Continue heating pad. Try to do some tow touches to help lightly stretch the back muscles. Avoid heavy lifting or overexertion. If anything worsens, please go to the ER.   Avoid chewing on the R side of your jaw. You seem to have some TMJ dysfunction. Tylenol should help with this. This should calm down quickly since it is mild and intermittent at present. If not improving we will need to get x-rays.

## 2016-11-20 DIAGNOSIS — M546 Pain in thoracic spine: Secondary | ICD-10-CM | POA: Insufficient documentation

## 2016-11-20 DIAGNOSIS — R252 Cramp and spasm: Secondary | ICD-10-CM | POA: Insufficient documentation

## 2016-11-20 DIAGNOSIS — M26621 Arthralgia of right temporomandibular joint: Secondary | ICD-10-CM | POA: Insufficient documentation

## 2016-11-20 NOTE — Assessment & Plan Note (Signed)
Occurring with prolonged sitting and alleviated with ambulation. Not consistent with Cardiac etiology. Originally also concerned for a GERD component. This is better with change in medication. Recommend Sports medicine assessment. Referral placed.

## 2016-11-20 NOTE — Assessment & Plan Note (Signed)
Discussed avoidance of chewing on affected side.  Topical Salon Pas. Ice. X-rays if not improving.

## 2016-11-20 NOTE — Assessment & Plan Note (Signed)
Will check h. Pylori today. If negative, recommend GI assessment. Continue current medication regimen.

## 2016-11-20 NOTE — Assessment & Plan Note (Signed)
Will check electrolytes today to include magnesium.  Stretching prior to activity recommended. Proper hydration reviewed.

## 2016-11-21 ENCOUNTER — Ambulatory Visit: Payer: Medicare Other | Admitting: Sports Medicine

## 2016-11-21 ENCOUNTER — Telehealth: Payer: Self-pay | Admitting: Physician Assistant

## 2016-11-21 ENCOUNTER — Ambulatory Visit (INDEPENDENT_AMBULATORY_CARE_PROVIDER_SITE_OTHER): Payer: Medicare Other

## 2016-11-21 ENCOUNTER — Encounter: Payer: Self-pay | Admitting: Sports Medicine

## 2016-11-21 ENCOUNTER — Ambulatory Visit (INDEPENDENT_AMBULATORY_CARE_PROVIDER_SITE_OTHER): Payer: Medicare Other | Admitting: Sports Medicine

## 2016-11-21 VITALS — BP 130/80 | HR 61 | Ht 65.0 in | Wt 153.4 lb

## 2016-11-21 DIAGNOSIS — R937 Abnormal findings on diagnostic imaging of other parts of musculoskeletal system: Secondary | ICD-10-CM | POA: Diagnosis not present

## 2016-11-21 DIAGNOSIS — M549 Dorsalgia, unspecified: Secondary | ICD-10-CM

## 2016-11-21 DIAGNOSIS — M069 Rheumatoid arthritis, unspecified: Secondary | ICD-10-CM

## 2016-11-21 DIAGNOSIS — M47814 Spondylosis without myelopathy or radiculopathy, thoracic region: Secondary | ICD-10-CM | POA: Diagnosis not present

## 2016-11-21 DIAGNOSIS — R918 Other nonspecific abnormal finding of lung field: Secondary | ICD-10-CM | POA: Diagnosis not present

## 2016-11-21 NOTE — Progress Notes (Signed)
OFFICE VISIT NOTE [name] Jailynne Opperman, [place] [facility] [place]  Dimple Bastyr - 83 y.o. female MRN [id number]  Date of birth: 23-Jul-1933  Visit Date: 11/21/2016  PCP: Midge Minium, MD   Referred by: Brunetta Jeans, PA-C  Jari Sportsman, cma acting as scribe for Dr. Paulla Fore.  SUBJECTIVE:   Chief Complaint  Patient presents with  . Right Sided Thoracic Back Pain  . Muscle Cramps   HPI: As below and per problem based documentation when appropriate.   Dahlila reports right sided thoracic back pain with unknown triggers. She feels that it initially started after she ate dinner x1 month ago.  Episodes are lasting several minutes to hours. There is a constant dull pain with sharp tendencies. Applied heat with some relief. Currently on Prednisone 5mg  daily for RA. Associated sx is increased gas.   New onset of muscle cramps in the left lower extremity starting at her calf to her foot. Normal CMP 11-18-2016. SX started fall 2017. Typically walks 6 miles per day but had to decrease the mileage due to the sx. She is staying hydrated.     Review of Systems  Constitutional: Negative for chills, diaphoresis, fever, malaise/fatigue and weight loss.  HENT: Negative.   Eyes: Negative.   Respiratory: Negative.   Cardiovascular: Negative.   Gastrointestinal: Negative.   Genitourinary: Negative.   Musculoskeletal: Positive for joint pain and myalgias. Negative for back pain, falls and neck pain.  Skin: Negative for itching and rash.  Neurological: Negative.  Negative for weakness.  Endo/Heme/Allergies: Negative for environmental allergies and polydipsia. Does not bruise/bleed easily.  Psychiatric/Behavioral: Negative.     Otherwise per HPI.  HISTORY & PERTINENT PRIOR DATA:  No specialty comments available. She reports that she has quit smoking. She has never used smokeless tobacco. No results for input(s): HGBA1C, LABURIC in the last  8760 hours. Medications & Allergies reviewed per EMR Patient Active Problem List   Diagnosis Date Noted  . Mid back pain 11/22/2016  . Abnormal x-ray of thoracic spine 11/22/2016  . Rheumatoid arthritis ([place]) 11/22/2016  . Muscle cramps 11/20/2016  . Subacute thoracic back pain 11/20/2016  . Arthralgia of right temporomandibular joint 11/20/2016  . Dysfunction of right eustachian tube 01/17/2015  . Neck pain on right side 01/17/2015  . Orbital pseudotumor 01/14/2014  . BPV (benign positional vertigo) 12/02/2013  . Allergic reaction 01/16/2011  . GERD 11/04/2008  . DIVERTICULOSIS-COLON 11/04/2008  . HYPERLIPIDEMIA 10/05/2008  . HYPERTENSION 10/05/2008  . Headache(784.0) 10/05/2008  . GROIN PAIN 10/05/2008  . VARICOSE VEINS, LOWER EXTREMITIES 01/16/2007  . HYPERGLYCEMIA 06/15/2006   Past Medical History:  Diagnosis Date  . Arrhythmia   . Arthritis   . Hyperlipidemia   . Hypertension   . IBS (irritable bowel syndrome)   . 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 Heart disease Mother   . Diabetes Sister   . Heart disease Sister    Past Surgical History:  Procedure Laterality Date  . ABDOMINAL HYSTERECTOMY    . APPENDECTOMY    . BOWEL RESECTION    . CATARACT EXTRACTION    . CHOLECYSTECTOMY    . HEMORROIDECTOMY    . ROTATOR CUFF REPAIR    . TONSILLECTOMY     Social History   Occupational History  . Not on file.   Social History Main Topics  . Smoking status: Former  Smoker  . Smokeless tobacco: Never Used  . Alcohol use No  . Drug use: No  . Sexual activity: No    OBJECTIVE:  VS:  HT:5\' 5"  (165.1 cm)   WT:153 lb 6.4 oz (69.6 kg)  BMI:25.6    BP:130/80  HR:61bpm  TEMP: ( )  RESP:98 % EXAM: Findings:  WDWN, NAD, Non-toxic appearing Alert & appropriately interactive Not depressed or anxious appearing No increased work of breathing. Pupils are equal. EOM intact without nystagmus No clubbing or cyanosis of  the extremities appreciated No significant rashes/lesions/ulcerations overlying the examined area. Radial pulses 2+/4.  No significant generalized UE edema. Sensation intact to light touch in upper extremities.  Back: Overall well aligned.  No significant scoliosis or kyphosis.  She has focal TTP over the right paraspinal musculature and ribs 789.  There is no appreciable mass but trigger point is appreciated.  She has overall good thoracic rotation and good thoracic excursion.     Dg Thoracic Spine W/swimmers  Result Date: 11/21/2016 CLINICAL DATA:  Acute right-sided thoracic pain at approximately the T7-8 level. EXAM: THORACIC SPINE - 3 VIEWS COMPARISON:  None. FINDINGS: There are 11 pairs of ribs. Mild S-shaped scoliosis is convex right in the upper thoracic spine and convex left at the thoracolumbar junction. There is trace anterolisthesis of T1 on T2. Vertebral body heights are preserved without evidence of fracture. Mild thoracic spondylosis is noted. Surgical clips are present in the right upper abdomen. Aortic atherosclerosis is noted. The lungs are hypoinflated without gross airspace consolidation, although there is a rounded 2.3 cm density projecting over the right hilum on the AP radiograph without clear correlate on the lateral radiograph. IMPRESSION: 1. Mild thoracic spondylosis and mild S-shaped scoliosis. 2. 2 cm rounded density at the level the right hilum. This may represent superimposition of normal hilar structures, however a lung nodule or enlarged hilar lymph node are also considerations. A chest CT is planned to further evaluate this as well as the right-sided thoracic pain. Study reviewed via telephone with Dr. Paulla Fore at the time of interpretation on 11/21/2016 at 4:30 p.m. Electronically Signed   By: Logan Bores M.D.   On: 11/21/2016 16:36   ASSESSMENT & PLAN:     ICD-10-CM   1. Mid back pain M54.9 DG Thoracic Spine W/Swimmers  2. Abnormal x-ray of thoracic spine R93.7   3.  Lung mass R91.8 CT Chest W Contrast    CANCELED: CT Chest W Contrast  4. Rheumatoid arthritis, involving unspecified site, unspecified rheumatoid factor presence (HCC) M06.9   ================================================================= Mid back pain Nonspecific right-sided mid back pain with no significant radicular component or specific etiology.  Seemingly worsening over the past month without relief from her daily prednisone that she is on for rheumatoid arthritis.  X-rays were obtained to rule out compression fracture that showed a well-circumscribed mass that is approximately in the region of her pain that is poorly characterized on plain film x-rays.  Further evaluation with CT scan of the chest with contrast has been ordered.  Ultimately this may represent benign enlarged hilar tissue in the setting of a slipped rib however until further diagnostic she can be completed manipulation be deferred.  I will plan follow-up with her soon as we have the results.  Rheumatoid arthritis ([place]) No report of prior lung issues.  I do not have any prior x-rays to compare her current thoracic film to.  Changes may reflect methotrexate use.  There are no Patient Instructions on file for  this visit.================================================================= Follow-up: Return for we will call you about your results.   >50% of this 30 minute visit spent in direct patient counseling and/or coordination of care.  This case to complex medical decision making and face-to-face discussion with radiology as well as general decision making with patient.    CMA/ATC served as Education administrator during this visit. History, Physical, and Plan performed by medical provider. Documentation and orders reviewed and attested to.      Teresa Coombs, [place] [facility] Physician

## 2016-11-21 NOTE — Telephone Encounter (Signed)
error 

## 2016-11-22 ENCOUNTER — Ambulatory Visit
Admission: RE | Admit: 2016-11-22 | Discharge: 2016-11-22 | Disposition: A | Discharge status: Home / Self Care | Payer: Medicare Other | Source: Ambulatory Visit | Attending: Sports Medicine | Admitting: Sports Medicine

## 2016-11-22 ENCOUNTER — Encounter: Payer: Self-pay | Admitting: Sports Medicine

## 2016-11-22 ENCOUNTER — Telehealth: Payer: Self-pay | Admitting: Sports Medicine

## 2016-11-22 ENCOUNTER — Ambulatory Visit: Payer: Medicare Other | Admitting: Family Medicine

## 2016-11-22 ENCOUNTER — Ambulatory Visit (INDEPENDENT_AMBULATORY_CARE_PROVIDER_SITE_OTHER): Payer: Medicare Other | Admitting: Sports Medicine

## 2016-11-22 VITALS — BP 110/80 | HR 65 | Ht 65.0 in | Wt 153.0 lb

## 2016-11-22 DIAGNOSIS — M9902 Segmental and somatic dysfunction of thoracic region: Secondary | ICD-10-CM | POA: Diagnosis not present

## 2016-11-22 DIAGNOSIS — R937 Abnormal findings on diagnostic imaging of other parts of musculoskeletal system: Secondary | ICD-10-CM

## 2016-11-22 DIAGNOSIS — M069 Rheumatoid arthritis, unspecified: Secondary | ICD-10-CM | POA: Insufficient documentation

## 2016-11-22 DIAGNOSIS — M549 Dorsalgia, unspecified: Secondary | ICD-10-CM | POA: Diagnosis not present

## 2016-11-22 DIAGNOSIS — R222 Localized swelling, mass and lump, trunk: Secondary | ICD-10-CM | POA: Diagnosis not present

## 2016-11-22 DIAGNOSIS — R918 Other nonspecific abnormal finding of lung field: Secondary | ICD-10-CM

## 2016-11-22 MED ORDER — IOPAMIDOL (ISOVUE-300) INJECTION 61%
75.0000 mL | Freq: Once | INTRAVENOUS | Status: AC | PRN
  Administered 2016-11-22: 75 mL via INTRAVENOUS

## 2016-11-22 NOTE — Progress Notes (Signed)
OFFICE VISIT NOTE Jennifer Boone. Jennifer Boone, Jennifer Boone at Jennifer Boone  Jennifer Boone - 83 y.o. female MRN [id number]  Date of birth: 1933/05/20  Visit Date: 11/22/2016  PCP: Jennifer Minium, MD   Referred by: Jennifer Minium, MD Jennifer Boone 16 Jennifer Boone, cma acting as scribe for Dr. Paulla Boone.  SUBJECTIVE:   Chief Complaint  Patient presents with  . Thoracic Back Pain   HPI: As below and per problem based documentation when appropriate.   Jennifer Boone had a CT Chest this morning due to abnormal thoracic back xray on 11/21/2016. She was called this morning with normal results. Following up today for OMT for current back pain.     Review of Systems  Constitutional: Negative.  Negative for chills, diaphoresis, fever, malaise/fatigue and weight loss.  HENT: Negative.   Eyes: Negative.   Respiratory: Negative.   Cardiovascular: Negative.   Gastrointestinal: Negative.   Genitourinary: Negative.   Musculoskeletal: Positive for back pain and myalgias. Negative for falls, joint pain and neck pain.  Skin: Negative.   Neurological: Negative.  Negative for weakness.  Endo/Heme/Allergies: Negative for environmental allergies and polydipsia. Does not bruise/bleed easily.  Psychiatric/Behavioral: Negative.     Otherwise per HPI.  HISTORY & PERTINENT PRIOR DATA:  No specialty comments available. She reports that she has quit smoking. She has never used smokeless tobacco. No results for input(s): HGBA1C, LABURIC in the last 8760 hours. Medications & Allergies reviewed per EMR Patient Active Problem List   Diagnosis Date Noted  . Mid back pain 11/22/2016  . Abnormal x-ray of thoracic spine 11/22/2016  . Rheumatoid arthritis ([place]) 11/22/2016  . Muscle cramps 11/20/2016  . Subacute thoracic back pain 11/20/2016  . Arthralgia of right temporomandibular joint 11/20/2016  . Dysfunction of right eustachian tube 01/17/2015  . Neck pain on right side  01/17/2015  . Orbital pseudotumor 01/14/2014  . BPV (benign positional vertigo) 12/02/2013  . Allergic reaction 01/16/2011  . GERD 11/04/2008  . DIVERTICULOSIS-COLON 11/04/2008  . HYPERLIPIDEMIA 10/05/2008  . HYPERTENSION 10/05/2008  . Headache(784.0) 10/05/2008  . GROIN PAIN 10/05/2008  . VARICOSE VEINS, LOWER EXTREMITIES 01/16/2007  . HYPERGLYCEMIA 06/15/2006   Past Medical History:  Diagnosis Date  . Arrhythmia   . Arthritis   . Hyperlipidemia   . Hypertension   . IBS (irritable bowel syndrome)   . 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 Heart disease Mother   . Diabetes Sister   . Heart disease Sister    Past Surgical History:  Procedure Laterality Date  . ABDOMINAL HYSTERECTOMY    . APPENDECTOMY    . BOWEL RESECTION    . CATARACT EXTRACTION    . CHOLECYSTECTOMY    . HEMORROIDECTOMY    . ROTATOR CUFF REPAIR    . TONSILLECTOMY     Social History   Occupational History  . Not on file.   Social History Main Topics  . Smoking status: Former Research scientist (life sciences)  . Smokeless tobacco: Never Used  . Alcohol use No  . Drug use: No  . Sexual activity: No    OBJECTIVE:  VS:  HT:5\' 5"  (165.1 cm)   WT:153 lb (69.4 kg)  BMI:25.5    BP:110/80  HR:65bpm  TEMP: ( )  RESP:98 % EXAM: Findings:  Adult female.  Here today with her husband.  In no acute distress.  Alert and appropriate.  Back is overall fairly  tender in the right midthoracic region with a rotation to the right from T6 through T8.  This responds nicely to osteopathic manipulation.       Dg Thoracic Spine W/swimmers  Result Date: 11/21/2016 CLINICAL DATA:  Acute right-sided thoracic pain at approximately the T7-8 level. EXAM: THORACIC SPINE - 3 VIEWS COMPARISON:  None. FINDINGS: There are 11 pairs of ribs. Mild S-shaped scoliosis is convex right in the upper thoracic spine and convex left at the thoracolumbar junction. There is trace anterolisthesis of T1 on T2.  Vertebral body Boone are preserved without evidence of fracture. Mild thoracic spondylosis is noted. Surgical clips are present in the right upper abdomen. Aortic atherosclerosis is noted. The lungs are hypoinflated without gross airspace consolidation, although there is a rounded 2.3 cm density projecting over the right hilum on the AP radiograph without clear correlate on the lateral radiograph. IMPRESSION: 1. Mild thoracic spondylosis and mild S-shaped scoliosis. 2. 2 cm rounded density at the level the right hilum. This may represent superimposition of normal hilar structures, however a lung nodule or enlarged hilar lymph node are also considerations. A chest CT is planned to further evaluate this as well as the right-sided thoracic pain. Study reviewed via telephone with Dr. Paulla Boone at the time of interpretation on 11/21/2016 at 4:30 p.m. Electronically Signed   By: Jennifer Boone M.D.   On: 11/21/2016 16:36   Ct Chest W Contrast  Result Date: 11/22/2016 CLINICAL DATA:  Lung mass seen on T-spine x-rays. Right hilar abnormality EXAM: CT CHEST WITH CONTRAST TECHNIQUE: Multidetector CT imaging of the chest was performed during intravenous contrast administration. CONTRAST:  58mL ISOVUE-300 IOPAMIDOL (ISOVUE-300) INJECTION 61% COMPARISON:  11/21/2016 FINDINGS: Cardiovascular: Scattered coronary artery calcifications and aortic calcifications. No aneurysm. Mediastinum/Nodes: No mediastinal, hilar, or axillary adenopathy. In particular, no right hilar adenopathy or abnormality as questioned on prior T-spine imaging. Thyroid unremarkable. Trachea and esophagus are unremarkable. Lungs/Pleura: Lungs are clear. No focal airspace opacities or suspicious nodules. No effusions. No right perihilar or right lung nodule or mass. Upper Abdomen: Imaging into the upper abdomen shows no acute findings. Musculoskeletal: Chest wall soft tissues are unremarkable. No acute bony abnormality. IMPRESSION: No right hilar adenopathy or  right lung nodule. No acute cardiopulmonary disease. Coronary artery disease. Aortic Atherosclerosis (ICD10-I70.0). Electronically Signed   By: Jennifer Boone M.D.   On: 11/22/2016 09:00   ASSESSMENT & PLAN:     ICD-10-CM   1. Mid back pain M54.9   2. Somatic dysfunction of thoracic region M99.02   3. Abnormal x-ray of thoracic spine R93.7   ================================================================= Mid back pain Symptoms are consistent with a intercostal strain and somatic dysfunction of the thoracic spine and ribs.  Osteopathic manipulation performed today and resulted in significant improvement of her symptoms.  Ultimately, luckily, the CT scan was normal and this is a simple musculoskeletal strain.  Abnormal x-ray of thoracic spine Abnormal chest x-ray with overall rate normal CT scan.  No evidence of thoracic compression fracture or other osseous irregularity ================================================================= There are no Patient Instructions on file for this visit.================================================================= No future appointments.  Follow-up: Return in about 2 weeks (around 12/06/2016).   CMA/ATC served as Education administrator during this visit. History, Physical, and Plan performed by medical provider. Documentation and orders reviewed and attested to.      Teresa Coombs, [place] [facility] Physician

## 2016-11-22 NOTE — Assessment & Plan Note (Signed)
No report of prior lung issues.  I do not have any prior x-rays to compare her current thoracic film to.  Changes may reflect methotrexate use.

## 2016-11-22 NOTE — Assessment & Plan Note (Signed)
Nonspecific right-sided mid back pain with no significant radicular component or specific etiology.  Seemingly worsening over the past month without relief from her daily prednisone that she is on for rheumatoid arthritis.  X-rays were obtained to rule out compression fracture that showed a well-circumscribed mass that is approximately in the region of her pain that is poorly characterized on plain film x-rays.  Further evaluation with CT scan of the chest with contrast has been ordered.  Ultimately this may represent benign enlarged hilar tissue in the setting of a slipped rib however until further diagnostic she can be completed manipulation be deferred.  I will plan follow-up with her soon as we have the results.

## 2016-11-22 NOTE — Telephone Encounter (Signed)
Patient called in to let Paulla Fore know she did a CAT scan this monring and it was completed before 9 am.

## 2016-11-22 NOTE — Telephone Encounter (Signed)
Called pt and advised of CT results per Dr. Paulla Fore. Pt scheduled to come in to follow up today.

## 2016-11-24 ENCOUNTER — Other Ambulatory Visit: Payer: Self-pay | Admitting: Physician Assistant

## 2016-11-26 DIAGNOSIS — L821 Other seborrheic keratosis: Secondary | ICD-10-CM | POA: Diagnosis not present

## 2016-11-26 DIAGNOSIS — L814 Other melanin hyperpigmentation: Secondary | ICD-10-CM | POA: Diagnosis not present

## 2016-11-26 DIAGNOSIS — D225 Melanocytic nevi of trunk: Secondary | ICD-10-CM | POA: Diagnosis not present

## 2016-11-26 DIAGNOSIS — L820 Inflamed seborrheic keratosis: Secondary | ICD-10-CM | POA: Diagnosis not present

## 2016-12-03 ENCOUNTER — Ambulatory Visit: Payer: Medicare Other | Admitting: Sports Medicine

## 2016-12-04 ENCOUNTER — Encounter: Payer: Self-pay | Admitting: Sports Medicine

## 2016-12-04 ENCOUNTER — Ambulatory Visit (INDEPENDENT_AMBULATORY_CARE_PROVIDER_SITE_OTHER): Payer: Medicare Other | Admitting: Sports Medicine

## 2016-12-04 VITALS — BP 120/78 | HR 60 | Ht 65.0 in | Wt 155.6 lb

## 2016-12-04 DIAGNOSIS — M9908 Segmental and somatic dysfunction of rib cage: Secondary | ICD-10-CM | POA: Diagnosis not present

## 2016-12-04 DIAGNOSIS — M549 Dorsalgia, unspecified: Secondary | ICD-10-CM

## 2016-12-04 NOTE — Progress Notes (Signed)
OFFICE VISIT NOTE Jennifer Boone - 83 y.o. female MRN [id number]  Date of birth: June 12, 1933  Visit Date: 12/04/2016  PCP: Jennifer Minium, MD   Referred by: Jennifer Minium, MD  Jennifer Boone, CMA acting as scribe for Dr. Paulla Boone.  SUBJECTIVE:   Chief Complaint  Patient presents with  . Follow-up    mid back pain   HPI: As below and per problem based documentation when appropriate.  Jennifer Boone is an established patient presenting today in follow-up of right sided mid back pain without radiation. She takes Prednisone daily for RA. Xray done 11/21/16 of the thoracic spine ruled out compression fracture. She had CT chest 11/22/16. OMT was done 11/22/16.   Pt reports that she was feeling a lot better after having OMT. She did have some pain last Sunday during church but it went away the next day. She doesn't recall doing anything out of the norm last Sunday that would have causes the back pain to recur. She is having no back pain today.     Review of Systems  Constitutional: Negative for chills and fever.  Respiratory: Negative for shortness of breath and wheezing.   Cardiovascular: Negative for chest pain and palpitations.  Musculoskeletal: Negative for back pain and falls.  Neurological: Negative for dizziness, tingling and headaches.  Endo/Heme/Allergies: Bruises/bleeds easily.    Otherwise per HPI.  HISTORY & PERTINENT PRIOR DATA:  No specialty comments available. She reports that she has quit smoking. She has never used smokeless tobacco. No results for input(s): HGBA1C, LABURIC in the last 8760 hours. Medications & Allergies reviewed per EMR Patient Active Problem List   Diagnosis Date Noted  . Mid back pain 11/22/2016  . Abnormal x-ray of thoracic spine 11/22/2016  . Rheumatoid arthritis ([place]) 11/22/2016  . Muscle cramps 11/20/2016  . Subacute thoracic back pain  11/20/2016  . Arthralgia of right temporomandibular joint 11/20/2016  . Dysfunction of right eustachian tube 01/17/2015  . Neck pain on right side 01/17/2015  . Orbital pseudotumor 01/14/2014  . BPV (benign positional vertigo) 12/02/2013  . Allergic reaction 01/16/2011  . GERD 11/04/2008  . DIVERTICULOSIS-COLON 11/04/2008  . HYPERLIPIDEMIA 10/05/2008  . HYPERTENSION 10/05/2008  . Headache(784.0) 10/05/2008  . GROIN PAIN 10/05/2008  . VARICOSE VEINS, LOWER EXTREMITIES 01/16/2007  . HYPERGLYCEMIA 06/15/2006   Past Medical History:  Diagnosis Date  . Arrhythmia   . Arthritis   . Hyperlipidemia   . Hypertension   . IBS (irritable bowel syndrome)   . 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 Heart disease Mother   . Diabetes Sister   . Heart disease Sister    Past Surgical History:  Procedure Laterality Date  . ABDOMINAL HYSTERECTOMY    . APPENDECTOMY    . BOWEL RESECTION    . CATARACT EXTRACTION    . CHOLECYSTECTOMY    . HEMORROIDECTOMY    . ROTATOR CUFF REPAIR    . TONSILLECTOMY     Social History   Occupational History  . Not on file.   Social History Main Topics  . Smoking status: Former Research scientist (life sciences)  . Smokeless tobacco: Never Used  . Alcohol use No  . Drug use: No  . Sexual activity: No    OBJECTIVE:  VS:  HT:5\' 5"  (165.1 cm)   WT:155 lb 9.6 oz (70.6 kg)  BMI:25.9    BP:120/78  HR:60bpm  TEMP: ( )  RESP:96 % EXAM: No additional findings.    Dg Thoracic Spine W/swimmers  Result Date: 11/21/2016 CLINICAL DATA:  Acute right-sided thoracic pain at approximately the T7-8 level. EXAM: THORACIC SPINE - 3 VIEWS COMPARISON:  None. FINDINGS: There are 11 pairs of ribs. Mild S-shaped scoliosis is convex right in the upper thoracic spine and convex left at the thoracolumbar junction. There is trace anterolisthesis of T1 on T2. Vertebral body heights are preserved without evidence of fracture. Mild thoracic spondylosis is  noted. Surgical clips are present in the right upper abdomen. Aortic atherosclerosis is noted. The lungs are hypoinflated without gross airspace consolidation, although there is a rounded 2.3 cm density projecting over the right hilum on the AP radiograph without clear correlate on the lateral radiograph. IMPRESSION: 1. Mild thoracic spondylosis and mild S-shaped scoliosis. 2. 2 cm rounded density at the level the right hilum. This may represent superimposition of normal hilar structures, however a lung nodule or enlarged hilar lymph node are also considerations. A chest CT is planned to further evaluate this as well as the right-sided thoracic pain. Study reviewed via telephone with Dr. Paulla Boone at the time of interpretation on 11/21/2016 at 4:30 p.m. Electronically Signed   By: Jennifer Boone M.D.   On: 11/21/2016 16:36   Ct Chest W Contrast  Result Date: 11/22/2016 CLINICAL DATA:  Lung mass seen on T-spine x-rays. Right hilar abnormality EXAM: CT CHEST WITH CONTRAST TECHNIQUE: Multidetector CT imaging of the chest was performed during intravenous contrast administration. CONTRAST:  27mL ISOVUE-300 IOPAMIDOL (ISOVUE-300) INJECTION 61% COMPARISON:  11/21/2016 FINDINGS: Cardiovascular: Scattered coronary artery calcifications and aortic calcifications. No aneurysm. Mediastinum/Nodes: No mediastinal, hilar, or axillary adenopathy. In particular, no right hilar adenopathy or abnormality as questioned on prior T-spine imaging. Thyroid unremarkable. Trachea and esophagus are unremarkable. Lungs/Pleura: Lungs are clear. No focal airspace opacities or suspicious nodules. No effusions. No right perihilar or right lung nodule or mass. Upper Abdomen: Imaging into the upper abdomen shows no acute findings. Musculoskeletal: Chest wall soft tissues are unremarkable. No acute bony abnormality. IMPRESSION: No right hilar adenopathy or right lung nodule. No acute cardiopulmonary disease. Coronary artery disease. Aortic  Atherosclerosis (ICD10-I70.0). Electronically Signed   By: Jennifer Boone M.D.   On: 11/22/2016 09:00   ASSESSMENT & PLAN:     ICD-10-CM   1. Mid back pain M54.9   2. Segmental and somatic dysfunction of rib cage M99.08 OSTEOPATHIC MANIPULATION TREATMENT  ================================================================= Mid back pain She has responded well to osteopathic manipulation last visit.  She had a slight return in her symptoms over the past several days but overall has done remarkably well.  We will plan to repeat this today and have her follow-up on an as-needed basis for recurrence of symptoms. =================================================================  Follow-up: Return if symptoms worsen or fail to improve.   CMA/ATC served as Education administrator during this visit. History, Physical, and Plan performed by medical provider. Documentation and orders reviewed and attested to.      Teresa Coombs, [place] [facility] Physician

## 2016-12-04 NOTE — Procedures (Signed)
PROCEDURE NOTE : OSTEOPATHIC MANIPULATION The decision today to treat with Osteopathic Manipulative Therapy (OMT) was based on physical exam findings. Verbal consent was obtained after after explanation of risks, benefits and potential side effects, including acute pain flare, post manipulation soreness and need for repeat treatments.  If Cervical manipulation was performed additional time was spent discussing the associated minimal risk of  injury to neurovascular structures.  After consent was obtained manipulation was performed as below:            Regions treated:  Per billing codes          Techniques used:  Direct and HVLA The patient tolerated the treatment well and reported Improved symptoms following treatment today. Patient was given medications, exercises, stretches and lifestyle modifications per AVS and verbally.

## 2016-12-05 DIAGNOSIS — L304 Erythema intertrigo: Secondary | ICD-10-CM | POA: Diagnosis not present

## 2016-12-19 ENCOUNTER — Encounter: Payer: Self-pay | Admitting: Physician Assistant

## 2016-12-19 ENCOUNTER — Ambulatory Visit (INDEPENDENT_AMBULATORY_CARE_PROVIDER_SITE_OTHER): Payer: Medicare Other | Admitting: Physician Assistant

## 2016-12-19 VITALS — BP 120/70 | HR 53 | Temp 97.9°F | Resp 14 | Ht 65.0 in | Wt 154.0 lb

## 2016-12-19 DIAGNOSIS — B9689 Other specified bacterial agents as the cause of diseases classified elsewhere: Secondary | ICD-10-CM | POA: Diagnosis not present

## 2016-12-19 DIAGNOSIS — J019 Acute sinusitis, unspecified: Secondary | ICD-10-CM

## 2016-12-19 DIAGNOSIS — M06041 Rheumatoid arthritis without rheumatoid factor, right hand: Secondary | ICD-10-CM | POA: Diagnosis not present

## 2016-12-19 MED ORDER — FLUTICASONE PROPIONATE 50 MCG/ACT NA SUSP: 2.0000 | Freq: Every day | NASAL | 6 refills | Status: DC

## 2016-12-19 MED ORDER — AZITHROMYCIN 250 MG PO TABS: ORAL_TABLET | ORAL | 0 refills | Status: DC

## 2016-12-19 NOTE — Patient Instructions (Signed)
Please take antibiotic as directed.  Increase fluid intake.  Use Saline nasal spray.  Take a daily multivitamin. Flonase as directed.  Place a humidifier in the bedroom.  Please call or return clinic if symptoms are not improving.  Sinusitis Sinusitis is redness, soreness, and swelling (inflammation) of the paranasal sinuses. Paranasal sinuses are air pockets within the bones of your face (beneath the eyes, the middle of the forehead, or above the eyes). In healthy paranasal sinuses, mucus is able to drain out, and air is able to circulate through them by way of your nose. However, when your paranasal sinuses are inflamed, mucus and air can become trapped. This can allow bacteria and other germs to grow and cause infection. Sinusitis can develop quickly and last only a short time (acute) or continue over a long period (chronic). Sinusitis that lasts for more than 12 weeks is considered chronic.  CAUSES  Causes of sinusitis include:  Allergies.  Structural abnormalities, such as displacement of the cartilage that separates your nostrils (deviated septum), which can decrease the air flow through your nose and sinuses and affect sinus drainage.  Functional abnormalities, such as when the small hairs (cilia) that line your sinuses and help remove mucus do not work properly or are not present. SYMPTOMS  Symptoms of acute and chronic sinusitis are the same. The primary symptoms are pain and pressure around the affected sinuses. Other symptoms include:  Upper toothache.  Earache.  Headache.  Bad breath.  Decreased sense of smell and taste.  A cough, which worsens when you are lying flat.  Fatigue.  Fever.  Thick drainage from your nose, which often is green and may contain pus (purulent).  Swelling and warmth over the affected sinuses. DIAGNOSIS  Your caregiver will perform a physical exam. During the exam, your caregiver may:  Look in your nose for signs of abnormal growths in your  nostrils (nasal polyps).  Tap over the affected sinus to check for signs of infection.  View the inside of your sinuses (endoscopy) with a special imaging device with a light attached (endoscope), which is inserted into your sinuses. If your caregiver suspects that you have chronic sinusitis, one or more of the following tests may be recommended:  Allergy tests.  Nasal culture A sample of mucus is taken from your nose and sent to a lab and screened for bacteria.  Nasal cytology A sample of mucus is taken from your nose and examined by your caregiver to determine if your sinusitis is related to an allergy. TREATMENT  Most cases of acute sinusitis are related to a viral infection and will resolve on their own within 10 days. Sometimes medicines are prescribed to help relieve symptoms (pain medicine, decongestants, nasal steroid sprays, or saline sprays).  However, for sinusitis related to a bacterial infection, your caregiver will prescribe antibiotic medicines. These are medicines that will help kill the bacteria causing the infection.  Rarely, sinusitis is caused by a fungal infection. In theses cases, your caregiver will prescribe antifungal medicine. For some cases of chronic sinusitis, surgery is needed. Generally, these are cases in which sinusitis recurs more than 3 times per year, despite other treatments. HOME CARE INSTRUCTIONS   Drink plenty of water. Water helps thin the mucus so your sinuses can drain more easily.  Use a humidifier.  Inhale steam 3 to 4 times a day (for example, sit in the bathroom with the shower running).  Apply a warm, moist washcloth to your face 3 to 4  times a day, or as directed by your caregiver.  Use saline nasal sprays to help moisten and clean your sinuses.  Take over-the-counter or prescription medicines for pain, discomfort, or fever only as directed by your caregiver. SEEK IMMEDIATE MEDICAL CARE IF:  You have increasing pain or severe  headaches.  You have nausea, vomiting, or drowsiness.  You have swelling around your face.  You have vision problems.  You have a stiff neck.  You have difficulty breathing. MAKE SURE YOU:   Understand these instructions.  Will watch your condition.  Will get help right away if you are not doing well or get worse. Document Released: 04/15/2005 Document Revised: 07/08/2011 Document Reviewed: 04/30/2011 [employer] Patient Information 2014 [employer].   

## 2016-12-19 NOTE — Progress Notes (Signed)
Pre visit review using our clinic review tool, if applicable. No additional management support is needed unless otherwise documented below in the visit note. 

## 2016-12-19 NOTE — Progress Notes (Signed)
Patient presents to clinic today c/o 2 weeks of sinus pressure, sinus pain, R ear pressure/pain and cough that is occasionally productive of green sputum.  Denies fever, chills. Denies chest pain or SOB. Has not taken anything for symptoms. Endorses history of seasonal allergies. Is currently using Ocean Spr[employer].   Past Medical History:  Diagnosis Date  . Arrhythmia   . Arthritis   . Hyperlipidemia   . Hypertension   . IBS (irritable bowel syndrome)   . 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 aspirin 81 MG tablet Take 81 mg by mouth daily.      . Coenzyme Q10 (COQ-10) 100 MG CAPS Take by mouth.    . Efinaconazole 10 % SOLN 1 drop to affected nail daily. 4 mL 0  . FIBER COMPLETE PO Take by mouth.    Marland Kitchen glucosamine-chondroitin 500-400 MG tablet Take 1 tablet by mouth 3 (three) times daily.      . methotrexate (RHEUMATREX) 2.5 MG tablet TK 4 TS PO 1 TIME WEEKLY  2  . Multiple Vitamins-Minerals (PRESERVISION AREDS 2 PO) Take by mouth.    Marland Kitchen omeprazole (PRILOSEC) 20 MG capsule Take 20 mg by mouth daily.      . predniSONE (DELTASONE) 5 MG tablet Take 5 mg by mouth daily with breakfast.    . propranolol (INDERAL) 10 MG tablet Take 10 mg by mouth 3 (three) times daily.    . ranitidine (ZANTAC) 150 MG tablet TAKE 1 TABLET(150 MG) BY MOUTH TWICE DAILY 60 tablet 5  . simvastatin (ZOCOR) 40 MG tablet Take 40 mg by mouth at bedtime.      . SUMAtriptan (IMITREX) 50 MG tablet Take 50 mg by mouth every 2 (two) hours as needed for migraine. May repeat in 2 hours if headache persists or recurs.    . cyclobenzaprine (FLEXERIL) 10 MG tablet 1/2-1 tab q8 hrs as needed for muscle spasm (Patient not taking: Reported on 12/19/2016) 30 tablet 0   No current facility-administered medications on file prior to visit.     Allergies  Allergen Reactions  . Celebrex [Celecoxib]     Hives, throat closes  . Tape     Hives,  cannot tolerate band-aids.   . Amoxicillin     REACTION: hives  . Cefuroxime Axetil Hives  . Clindamycin     REACTION: hives  . Codeine     Cannot remember, bad reaction  . Tetracycline     REACTION: GI upset    Family History  Problem Relation Age of Onset  . Heart disease Mother   . Diabetes Sister   . Heart disease Sister     Social History   Social History  . Marital status: Married    Spouse name: N/A  . Number of children: N/A  . Years of education: N/A   Social History Main Topics  . Smoking status: Former Research scientist (life sciences)  . Smokeless tobacco: Never Used  . Alcohol use No  . Drug use: No  . Sexual activity: No   Other Topics Concern  . None   Social History Narrative  . None   Review of Systems - See HPI.  All other ROS are negative.  BP 120/70   Pulse (!) 53   Temp 97.9 F (36.6 C) (Oral)   Resp 14   Ht 5' 5"  (1.651 m)   Wt 154 lb (69.9 kg)  SpO2 96%   BMI 25.63 kg/m   Physical Exam  Constitutional: She is oriented to person, place, and time and well-developed, well-nourished, and in no distress.  HENT:  Head: Normocephalic and atraumatic.  Right Ear: External ear normal.  Left Ear: External ear normal.  Nose: Nose normal.  Mouth/Throat: Oropharynx is clear and moist. No oropharyngeal exudate.  Eyes: Conjunctivae are normal.  Neck: Neck supple.  Cardiovascular: Normal rate, regular rhythm, normal heart sounds and intact distal pulses.   Pulmonary/Chest: Effort normal and breath sounds normal. No respiratory distress. She has no wheezes. She has no rales. She exhibits no tenderness.  Abdominal: Soft. Bowel sounds are normal.  Neurological: She is alert and oriented to person, place, and time.  Skin: Skin is warm and dry. No rash noted.  Psychiatric: Affect normal.  Vitals reviewed.   Recent Results (from the past 2160 hour(s))  Lipase     Status: None   Collection Time: 10/28/16  4:54 PM  Result Value Ref Range   Lipase 14 7 - 60 U/L  Comp  Met (CMET)     Status: None   Collection Time: 10/28/16  4:54 PM  Result Value Ref Range   Sodium 135 135 - 146 mmol/L   Potassium 4.5 3.5 - 5.3 mmol/L   Chloride 100 98 - 110 mmol/L   CO2 24 20 - 31 mmol/L   Glucose, Bld 98 65 - 99 mg/dL   BUN 14 7 - 25 mg/dL   Creat 0.69 0.60 - 0.88 mg/dL    Comment:   For patients > or = 81 years of age: The upper reference limit for Creatinine is approximately 13% higher for people identified as African-American.      Total Bilirubin 0.7 0.2 - 1.2 mg/dL   Alkaline Phosphatase 70 33 - 130 U/L   AST 18 10 - 35 U/L   ALT 22 6 - 29 U/L   Total Protein 6.7 6.1 - 8.1 g/dL   Albumin 4.2 3.6 - 5.1 g/dL   Calcium 9.3 8.6 - 10.4 mg/dL  Troponin I     Status: None   Collection Time: 10/28/16  4:54 PM  Result Value Ref Range   Troponin I <0.01 <=0.05 ng/mL    Comment:   In accord with published recommendations, serial testing of Troponin I at intervals of 2 to 4 hours for up to 12 to 24 hours is suggested in order to corroborate a single Troponin I result. An elevated troponin alone is not sufficient to make the diagnosis of MI. ** Please note change in reference range(s). **     Magnesium     Status: None   Collection Time: 11/18/16  9:37 AM  Result Value Ref Range   Magnesium 2.0 1.5 - 2.5 mg/dL  H. pylori antibody, IgG     Status: None   Collection Time: 11/18/16  9:37 AM  Result Value Ref Range   H Pylori IgG Negative Negative  Comp Met (CMET)     Status: Abnormal   Collection Time: 11/18/16  9:37 AM  Result Value Ref Range   Sodium 137 135 - 145 mEq/L   Potassium 4.3 3.5 - 5.1 mEq/L   Chloride 99 96 - 112 mEq/L   CO2 30 19 - 32 mEq/L   Glucose, Bld 117 (H) 70 - 99 mg/dL   BUN 11 6 - 23 mg/dL   Creatinine, Ser 0.71 0.40 - 1.20 mg/dL   Total Bilirubin 0.4 0.2 - 1.2 mg/dL  Alkaline Phosphatase 66 39 - 117 U/L   AST 15 0 - 37 U/L   ALT 16 0 - 35 U/L   Total Protein 6.3 6.0 - 8.3 g/dL   Albumin 4.2 3.5 - 5.2 g/dL   Calcium 9.7 8.4  - 10.5 mg/dL   GFR 84.00 >60.00 mL/min   Assessment/Plan: 1. Acute bacterial sinusitis Rx Azithromycin.  Increase fluids.  Rest.  Saline nasal spray.  Probiotic.  Mucinex as directed.  Humidifier in bedroom. Flonase per orders.  Call or return to clinic if symptoms are not improving.  - azithromycin (ZITHROMAX) 250 MG tablet; Take 2 tablets on Day 1. Then 1 tablet daily.  Dispense: 6 tablet; Refill: 0   Leeanne Rio, [place]

## 2016-12-20 ENCOUNTER — Telehealth: Payer: Self-pay | Admitting: Family Medicine

## 2016-12-20 DIAGNOSIS — B9689 Other specified bacterial agents as the cause of diseases classified elsewhere: Secondary | ICD-10-CM

## 2016-12-20 DIAGNOSIS — J019 Acute sinusitis, unspecified: Principal | ICD-10-CM

## 2016-12-20 MED ORDER — BENZONATATE 100 MG PO CAPS: 100.0000 mg | ORAL_CAPSULE | Freq: Three times a day (TID) | ORAL | 0 refills | Status: DC | PRN

## 2016-12-20 NOTE — Telephone Encounter (Signed)
Pt states that she seen [facility] yesterday and has started Rx, however pt states that she was up all night with a cough and asking if there is anything that she could do to help with this.

## 2016-12-20 NOTE — Telephone Encounter (Signed)
Spoke with patient and advised that we can send in Tessalon pearls for the cough. She is agreeable. Rx sent to the pharmacy.

## 2016-12-20 NOTE — Telephone Encounter (Signed)
Can send in Rx Tessalon Perles 100 mg TID PRN quantity 30 with 0 refills.

## 2016-12-21 NOTE — Assessment & Plan Note (Signed)
Abnormal chest x-ray with overall rate normal CT scan.  No evidence of thoracic compression fracture or other osseous irregularity

## 2016-12-21 NOTE — Assessment & Plan Note (Signed)
Symptoms are consistent with a intercostal strain and somatic dysfunction of the thoracic spine and ribs.  Osteopathic manipulation performed today and resulted in significant improvement of her symptoms.  Ultimately, luckily, the CT scan was normal and this is a simple musculoskeletal strain.

## 2016-12-22 NOTE — Assessment & Plan Note (Signed)
She has responded well to osteopathic manipulation last visit.  She had a slight return in her symptoms over the past several days but overall has done remarkably well.  We will plan to repeat this today and have her follow-up on an as-needed basis for recurrence of symptoms.

## 2016-12-26 ENCOUNTER — Ambulatory Visit (INDEPENDENT_AMBULATORY_CARE_PROVIDER_SITE_OTHER): Payer: Medicare Other | Admitting: Family Medicine

## 2016-12-26 ENCOUNTER — Encounter: Payer: Self-pay | Admitting: Family Medicine

## 2016-12-26 VITALS — BP 120/81 | HR 76 | Temp 98.0°F | Resp 16 | Ht 65.0 in | Wt 156.1 lb

## 2016-12-26 DIAGNOSIS — R05 Cough: Secondary | ICD-10-CM

## 2016-12-26 DIAGNOSIS — R059 Cough, unspecified: Secondary | ICD-10-CM

## 2016-12-26 MED ORDER — ALBUTEROL SULFATE (2.5 MG/3ML) 0.083% IN NEBU
2.5000 mg | INHALATION_SOLUTION | Freq: Once | RESPIRATORY_TRACT | Status: AC
  Administered 2016-12-26: 2.5 mg via RESPIRATORY_TRACT

## 2016-12-26 MED ORDER — BENZONATATE 200 MG PO CAPS: 200.0000 mg | ORAL_CAPSULE | Freq: Three times a day (TID) | ORAL | 0 refills | Status: DC | PRN

## 2016-12-26 MED ORDER — CETIRIZINE HCL 10 MG PO TABS: 10.0000 mg | ORAL_TABLET | Freq: Every day | ORAL | 11 refills | Status: DC

## 2016-12-26 MED ORDER — PREDNISONE 10 MG PO TABS: ORAL_TABLET | ORAL | 0 refills | Status: DC

## 2016-12-26 NOTE — Progress Notes (Signed)
Pre visit review using our clinic review tool, if applicable. No additional management support is needed unless otherwise documented below in the visit note. 

## 2016-12-26 NOTE — Patient Instructions (Signed)
Follow up as needed or as scheduled Start a daily Zyrtec to help with the allergy component Start the Prednisone- 3 pills at the same time x3 days, then 2 pills x3 days, and then 1 pill daily x3 days before you resume your usual 5mg  daily Drink plenty of fluids Use the Tessalon as needed for cough Continue the Flonase- 2 sprays each nostril daily Call with any questions or concerns Hang in there!

## 2016-12-26 NOTE — Progress Notes (Signed)
   Subjective:    Patient ID: Jennifer Boone, female    DOB: [birth year], 83 y.o.   MRN: [id number]  HPI Chest congestion- pt was seen 8/20 and dx'd w/ acute sinusitis.  She was tx'd w/ a Zpack and Tessalon- she finished both.  Currently taking Mucinex and Flonase.  Not currently taking a daily antihistamine.  Cough is productive of yellow-green sputum.  No fevers.   Review of Systems For ROS see HPI     Objective:   Physical Exam  Constitutional: She is oriented to person, place, and time. She appears well-developed and well-nourished. No distress.  HENT:  Head: Normocephalic and atraumatic.  Right Ear: Tympanic membrane normal.  Left Ear: Tympanic membrane normal.  Nose: Mucosal edema and rhinorrhea present. Right sinus exhibits no maxillary sinus tenderness and no frontal sinus tenderness. Left sinus exhibits no maxillary sinus tenderness and no frontal sinus tenderness.  Mouth/Throat: Mucous membranes are normal. Posterior oropharyngeal erythema (w/ PND) present.  Eyes: Pupils are equal, round, and reactive to light. Conjunctivae and EOM are normal.  Neck: Normal range of motion. Neck supple.  Cardiovascular: Normal rate, regular rhythm and normal heart sounds.   Pulmonary/Chest: Effort normal and breath sounds normal. No respiratory distress. She has no wheezes. She has no rales.  Near continuous cough- improved s/p neb tx  Lymphadenopathy:    She has no cervical adenopathy.  Neurological: She is alert and oriented to person, place, and time.  Skin: Skin is warm and dry.  Psychiatric: She has a normal mood and affect. Her behavior is normal. Thought content normal.  Vitals reviewed.         Assessment & Plan:  Cough- new.  Suspect pt has a post-infectious cough based on her sxs and PE.  Cough improved w/ neb tx in office.  Reviewed dx and treatment- daily antihistamine, nasal steroid, prednisone taper, and cough meds prn.  Reviewed supportive care and red flags that should  prompt return.  Pt expressed understanding and is in agreement w/ plan.

## 2017-01-07 DIAGNOSIS — Z23 Encounter for immunization: Secondary | ICD-10-CM | POA: Diagnosis not present

## 2017-01-16 ENCOUNTER — Ambulatory Visit (INDEPENDENT_AMBULATORY_CARE_PROVIDER_SITE_OTHER): Payer: Medicare Other | Admitting: Family Medicine

## 2017-01-16 ENCOUNTER — Encounter: Payer: Self-pay | Admitting: Family Medicine

## 2017-01-16 VITALS — BP 121/71 | HR 68 | Temp 97.9°F | Resp 17 | Ht 65.0 in | Wt 150.1 lb

## 2017-01-16 DIAGNOSIS — J040 Acute laryngitis: Secondary | ICD-10-CM | POA: Diagnosis not present

## 2017-01-16 DIAGNOSIS — J301 Allergic rhinitis due to pollen: Secondary | ICD-10-CM | POA: Diagnosis not present

## 2017-01-16 DIAGNOSIS — J309 Allergic rhinitis, unspecified: Secondary | ICD-10-CM | POA: Insufficient documentation

## 2017-01-16 NOTE — Progress Notes (Signed)
Pre visit review using our clinic review tool, if applicable. No additional management support is needed unless otherwise documented below in the visit note. 

## 2017-01-16 NOTE — Progress Notes (Signed)
   Subjective:    Patient ID: Jennifer Boone, female    DOB: [birth year], 83 y.o.   MRN: [id number]  HPI Sore throat- sxs started 'a couple of days ago' and yesterday lost her voice.  No fevers.  No sinus pain/pressure.  Bilateral ear pain.  No cough.  No known sick contacts.  8/23 had sinusitis, 8/30 w/ post infectious cough.  Taking Zyrtec daily but not using Flonase.   Review of Systems For ROS see HPI     Objective:   Physical Exam  Constitutional: She is oriented to person, place, and time. She appears well-developed and well-nourished. No distress.  HENT:  Head: Normocephalic and atraumatic.  Right Ear: Tympanic membrane normal.  Left Ear: Tympanic membrane normal.  Nose: Mucosal edema and rhinorrhea present. Right sinus exhibits no maxillary sinus tenderness and no frontal sinus tenderness. Left sinus exhibits no maxillary sinus tenderness and no frontal sinus tenderness.  Mouth/Throat: Mucous membranes are normal. Posterior oropharyngeal erythema (w/ PND) present.  Eyes: Pupils are equal, round, and reactive to light. Conjunctivae and EOM are normal.  Neck: Normal range of motion. Neck supple.  Cardiovascular: Normal rate, regular rhythm and normal heart sounds.   Pulmonary/Chest: Effort normal and breath sounds normal. No respiratory distress. She has no wheezes. She has no rales.  Lymphadenopathy:    She has no cervical adenopathy.  Neurological: She is alert and oriented to person, place, and time.  Psychiatric: She has a normal mood and affect. Her behavior is normal. Thought content normal.  Vitals reviewed.         Assessment & Plan:  Allergic rhinitis- deteriorated.  Pt had stopped her Flonase.  She is having sore throat and laryngitis due to PND.  No evidence of infxn so no need for abx.  Reviewed supportive care and red flags that should prompt return.  Pt expressed understanding and is in agreement w/ plan.

## 2017-01-16 NOTE — Patient Instructions (Signed)
Follow up as needed or as scheduled Continue the Zyrtec daily Restart the Flonase- 2 sprays each nostril daily Drink plenty of fluids Tylenol or ibuprofen (Advil) as needed for discomfort Vocal rest!!!! Call with any questions or concerns Hang in there!!!

## 2017-01-31 ENCOUNTER — Ambulatory Visit (INDEPENDENT_AMBULATORY_CARE_PROVIDER_SITE_OTHER): Payer: Medicare Other | Admitting: Nurse Practitioner

## 2017-01-31 ENCOUNTER — Encounter: Payer: Self-pay | Admitting: Nurse Practitioner

## 2017-01-31 ENCOUNTER — Ambulatory Visit (HOSPITAL_BASED_OUTPATIENT_CLINIC_OR_DEPARTMENT_OTHER)
Admission: RE | Admit: 2017-01-31 | Discharge: 2017-01-31 | Disposition: A | Discharge status: Home / Self Care | Payer: Medicare Other | Source: Ambulatory Visit | Attending: Nurse Practitioner | Admitting: Nurse Practitioner

## 2017-01-31 ENCOUNTER — Other Ambulatory Visit: Payer: Self-pay | Admitting: Nurse Practitioner

## 2017-01-31 VITALS — BP 118/68 | HR 62 | Temp 97.8°F | Resp 18 | Ht 65.0 in | Wt 146.2 lb

## 2017-01-31 DIAGNOSIS — R059 Cough, unspecified: Secondary | ICD-10-CM

## 2017-01-31 DIAGNOSIS — R05 Cough: Secondary | ICD-10-CM | POA: Insufficient documentation

## 2017-01-31 DIAGNOSIS — J301 Allergic rhinitis due to pollen: Secondary | ICD-10-CM | POA: Diagnosis not present

## 2017-01-31 DIAGNOSIS — K219 Gastro-esophageal reflux disease without esophagitis: Secondary | ICD-10-CM

## 2017-01-31 DIAGNOSIS — R918 Other nonspecific abnormal finding of lung field: Secondary | ICD-10-CM | POA: Diagnosis not present

## 2017-01-31 MED ORDER — RANITIDINE HCL 300 MG PO TABS: 300.0000 mg | ORAL_TABLET | Freq: Every day | ORAL | 1 refills | Status: DC

## 2017-01-31 NOTE — Progress Notes (Signed)
Subjective:    Patient ID: Jennifer Boone, female    DOB: Jun 08, 1933, 83 y.o.   MRN: [id number]  HPI Jennifer Boone is an 83 year old female who presents today with c/o dry cough. She also c/o right ear pain, rhinorrhea. She was seen on 8/30 by her primary care provider for a productive cough and prescribed daily zyrtec and flonase and a prednisone taper. She completed the prednisone, has continued zyrtec and flonase. She also take a prilosec every other day for GERD. Her cough did resolve but returned about two weeks ago and has been daily since. Shes tried tessalon which has not provided relief. She denies fevers, sneezing, itchy or watery eyes, heartburn, regurgitation, globus, trouble swallowing, chest pain, shortness of breath, edema. She quit smoking 40 years ago. Overall, she feels well.  Review of Systems  See HPI  Past Medical History:  Diagnosis Date  . Allergy   . Arrhythmia   . Arthritis   . GERD (gastroesophageal reflux disease)   . Hyperlipidemia   . Hypertension   . IBS (irritable bowel syndrome)   . 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 Marital status: Married    Spouse name: N/A  . Number of children: N/A  . Years of education: N/A   Occupational History  . Not on file.   Social History Main Topics  . Smoking status: Former Smoker    Types: Cigarettes    Quit date: 04/29/1976  . Smokeless tobacco: Never Used  . Alcohol use No  . Drug use: No  . Sexual activity: No   Other Topics Concern  . Not on file   Social History Narrative  . No narrative on file    Past Surgical History:  Procedure Laterality Date  . ABDOMINAL HYSTERECTOMY    . APPENDECTOMY    . BOWEL RESECTION    . CATARACT EXTRACTION    . CHOLECYSTECTOMY    . HEMORROIDECTOMY    . ROTATOR CUFF REPAIR    . TONSILLECTOMY      Family History  Problem Relation Age of Onset  . Heart disease Mother   . Diabetes Sister   . Heart disease Sister      Allergies  Allergen Reactions  . Celebrex [Celecoxib]     Hives, throat closes  . Tape     Hives, cannot tolerate surgical tape.   . Amoxicillin     REACTION: hives  . Cefuroxime Axetil Hives  . Clindamycin     REACTION: hives  . Codeine     Cannot remember, bad reaction  . Grass Extracts [Gramineae Pollens]     Lab tests positive  . Tetracycline     REACTION: GI upset  . Tree Extract     Lab positive    Current Outpatient Prescriptions on File Prior to Visit  Medication Sig Dispense Refill  . cetirizine (ZYRTEC) 10 MG tablet Take 1 tablet (10 mg total) by mouth daily. 30 tablet 11  . Coenzyme Q10 (COQ-10) 100 MG CAPS Take by mouth.    . FIBER COMPLETE PO Take by mouth.    . fluticasone (FLONASE) 50 MCG/ACT nasal spray Place 2 sprays into both nostrils daily. 16 g 6  . glucosamine-chondroitin 500-400 MG tablet Take 1 tablet by mouth 3 (three) times daily.      . methotrexate (RHEUMATREX) 2.5 MG tablet TK 4 TS PO 1 TIME WEEKLY  2  .  Multiple Vitamins-Minerals (PRESERVISION AREDS 2 PO) Take by mouth.    Marland Kitchen omeprazole (PRILOSEC) 20 MG capsule Take 20 mg by mouth daily.      . propranolol (INDERAL) 10 MG tablet Take 10 mg by mouth 3 (three) times daily.    . simvastatin (ZOCOR) 40 MG tablet Take 40 mg by mouth at bedtime.      . SUMAtriptan (IMITREX) 50 MG tablet Take 50 mg by mouth every 2 (two) hours as needed for migraine. May repeat in 2 hours if headache persists or recurs.    Marland Kitchen aspirin 81 MG tablet Take 81 mg by mouth daily.       No current facility-administered medications on file prior to visit.     BP 118/68 (BP Location: Right Arm, Cuff Size: Normal)   Pulse 62   Temp 97.8 F (36.6 C) (Oral)   Resp 18   Ht 5\' 5"  (1.651 m)   Wt 146 lb 3.2 oz (66.3 kg)   SpO2 96%   BMI 24.33 kg/m        Objective:   Physical Exam  Constitutional: She is oriented to person, place, and time. She appears well-developed and well-nourished. No distress.  HENT:  Head:  Normocephalic and atraumatic.  Right Ear: External ear normal. No drainage, swelling or tenderness. Tympanic membrane is not erythematous. No decreased hearing is noted.  Left Ear: External ear normal. No drainage, swelling or tenderness. Tympanic membrane is not erythematous. No decreased hearing is noted.  Nose: Nose normal.  Mouth/Throat: Oropharynx is clear and moist. No oropharyngeal exudate.  Dull TM's with light reflex present bilaterally.  Eyes: Pupils are equal, round, and reactive to light. Right eye exhibits no discharge. Left eye exhibits no discharge.  Neck: Normal range of motion. Neck supple.  Cardiovascular: Normal rate, regular rhythm and normal heart sounds.   Pulmonary/Chest: Effort normal and breath sounds normal. No respiratory distress. She has no wheezes. She has no rales.  Musculoskeletal: Normal range of motion.  Lymphadenopathy:    She has no cervical adenopathy.  Neurological: She is alert and oriented to person, place, and time.  Skin: Skin is warm and dry.  Psychiatric: She has a normal mood and affect. Judgment and thought content normal.        Assessment & Plan:  Cough Chest x-ray has been ordered to rule out pneumonia, chest infection, though my suspicion is low due to absence of fevers, normal vital signs, and patient feels overall well. She will start Ranitidine at bedtime in addition to her every other day Prilosec. She will increase her Zyrtec to twice daily for the next month and continue her Flonase. She currently takes a probiotic, which she will continue. She will begin to wean herself off of Prilosec and decrease the Zyrtec back to once daily in a month. She will decrease these medications one at a time, while paying attention to return of symptoms/cough in order to determine if the cough is related to GERD vs allergic rhinitis. She is heading to [place] for the winter, but will plan to follow up with her healthcare provider there in 4-6 weeks to  discuss her symptoms and plan. ER and return precautions given. She was agreeable to this plan.

## 2017-01-31 NOTE — Patient Instructions (Addendum)
Please go downstairs for a chest x-ray today.  I have sent a prescription for zantac to your pharmacy. I would like for you to take 1 each night before bed.  I would also like for you to increase your zyrtec to twice daily for the next month.  Please pick up probiotics (over the counter) from your pharmacy and start daily.  If you develop fevers over 101, chest pain, or shortness of breath, please go immediately to the nearest emergency department.  I'd like you to follow up with your primary doctor in 4-6 weeks for a re-check, or sooner if your symptoms do not improve.  Thanks for letting me take care of you today :)   Gastroesophageal Reflux Disease, Adult Normally, food travels down the esophagus and stays in the stomach to be digested. If a person has gastroesophageal reflux disease (GERD), food and stomach acid move back up into the esophagus. When this happens, the esophagus becomes sore and swollen (inflamed). Over time, GERD can make small holes (ulcers) in the lining of the esophagus. Follow these instructions at home: Diet  Follow a diet as told by your doctor. You may need to avoid foods and drinks such as: ? Coffee and tea (with or without caffeine). ? Drinks that contain alcohol. ? Energy drinks and sports drinks. ? Carbonated drinks or sodas. ? Chocolate and cocoa. ? Peppermint and mint flavorings. ? Garlic and onions. ? Horseradish. ? Spicy and acidic foods, such as peppers, chili powder, curry powder, vinegar, hot sauces, and BBQ sauce. ? Citrus fruit juices and citrus fruits, such as oranges, lemons, and limes. ? Tomato-based foods, such as red sauce, chili, salsa, and pizza with red sauce. ? Fried and fatty foods, such as donuts, french fries, potato chips, and high-fat dressings. ? High-fat meats, such as hot dogs, rib eye steak, sausage, ham, and bacon. ? High-fat dairy items, such as whole milk, butter, and cream cheese.  Eat small meals often. Avoid eating  large meals.  Avoid drinking large amounts of liquid with your meals.  Avoid eating meals during the 2-3 hours before bedtime.  Avoid lying down right after you eat.  Do not exercise right after you eat. General instructions  Pay attention to any changes in your symptoms.  Take over-the-counter and prescription medicines only as told by your doctor. Do not take aspirin, ibuprofen, or other NSAIDs unless your doctor says it is okay.  Do not use any tobacco products, including cigarettes, chewing tobacco, and e-cigarettes. If you need help quitting, ask your doctor.  Wear loose clothes. Do not wear anything tight around your waist.  Raise (elevate) the head of your bed about 6 inches (15 cm).  Try to lower your stress. If you need help doing this, ask your doctor.  If you are overweight, lose an amount of weight that is healthy for you. Ask your doctor about a safe weight loss goal.  Keep all follow-up visits as told by your doctor. This is important. Contact a doctor if:  You have new symptoms.  You lose weight and you do not know why it is happening.  You have trouble swallowing, or it hurts to swallow.  You have wheezing or a cough that keeps happening.  Your symptoms do not get better with treatment.  You have a hoarse voice. Get help right away if:  You have pain in your arms, neck, jaw, teeth, or back.  You feel sweaty, dizzy, or light-headed.  You have chest pain  or shortness of breath.  You throw up (vomit) and your throw up looks like blood or coffee grounds.  You pass out (faint).  Your poop (stool) is bloody or black.  You cannot swallow, drink, or eat. This information is not intended to replace advice given to you by your health care provider. Make sure you discuss any questions you have with your health care provider. Document Released: 10/02/2007 Document Revised: 09/21/2015 Document Reviewed: 08/10/2014 Elsevier Interactive Patient Education   [employer].

## 2017-01-31 NOTE — Assessment & Plan Note (Addendum)
She will start Ranitidine at bedtime in addition to her every other day prilosec. She will begin to wean herself off of prilosec and decrease her Zytrec from twice daily to once daily in a month. She will decrease these medications one at a time, while paying attention to return of symptoms/cough, in order to determine if the cough is related to GERD vs allergic rhinitis. She is heading to [place] for the winter, but will plan to follow up with her healthcare provider there in 4-6 weeks to discuss her symptoms and plan.

## 2017-01-31 NOTE — Assessment & Plan Note (Addendum)
She will increase her Zyrtec twice daily for the next month and continue her flonase.  She will begin to wean herself off of prilosec and decrease the zyrtec back to once daily in a month. She will decrease these medications one at a time, while paying attention to return of symptoms/cough, in order to determine if the cough is related to GERD vs allergic rhinitis. She is heading to [place] for the winter, but will plan to follow up with her healthcare provider there in 4-6 weeks to discuss her symptoms and plan.

## 2017-02-03 ENCOUNTER — Telehealth: Payer: Self-pay | Admitting: Nurse Practitioner

## 2017-02-03 DIAGNOSIS — M06042 Rheumatoid arthritis without rheumatoid factor, left hand: Secondary | ICD-10-CM | POA: Diagnosis not present

## 2017-02-03 DIAGNOSIS — Z6824 Body mass index (BMI) 24.0-24.9, adult: Secondary | ICD-10-CM | POA: Diagnosis not present

## 2017-02-03 DIAGNOSIS — M06041 Rheumatoid arthritis without rheumatoid factor, right hand: Secondary | ICD-10-CM | POA: Diagnosis not present

## 2017-02-03 DIAGNOSIS — R059 Cough, unspecified: Secondary | ICD-10-CM

## 2017-02-03 DIAGNOSIS — R05 Cough: Secondary | ICD-10-CM

## 2017-02-03 DIAGNOSIS — H04123 Dry eye syndrome of bilateral lacrimal glands: Secondary | ICD-10-CM | POA: Diagnosis not present

## 2017-02-03 DIAGNOSIS — E663 Overweight: Secondary | ICD-10-CM | POA: Diagnosis not present

## 2017-02-03 DIAGNOSIS — M2550 Pain in unspecified joint: Secondary | ICD-10-CM | POA: Diagnosis not present

## 2017-02-03 NOTE — Telephone Encounter (Signed)
Please let the patient know that her chest x-ray did not show any infection or pneumonia. It did show some lung changes, which could be related to underlying asthma or lung conditions. I'd like her to follow up with pulmonology to discuss, I've sent a referral for her. Thanks!

## 2017-02-03 NOTE — Telephone Encounter (Signed)
Notified pt of below and she voices understanding. Reports that she will be spending the next 6 months in [place] and would like CXR and referral mailed to her. Information mailed.

## 2017-02-06 ENCOUNTER — Telehealth: Payer: Self-pay | Admitting: Family Medicine

## 2017-02-06 NOTE — Telephone Encounter (Signed)
Relation to IF:XGXI Call back number:[phone]  Reason for call:  Patient was seen 01/31/17 by Lance Sell, NP, patient requesting imaging result please maile to home  [address] [place] [id number]

## 2017-02-07 DIAGNOSIS — H04123 Dry eye syndrome of bilateral lacrimal glands: Secondary | ICD-10-CM | POA: Diagnosis not present

## 2017-02-07 DIAGNOSIS — Z8669 Personal history of other diseases of the nervous system and sense organs: Secondary | ICD-10-CM | POA: Diagnosis not present

## 2017-02-07 NOTE — Telephone Encounter (Signed)
Information has already been mailed to pt. See 02/03/17 phone note. Mailed cxr and referral again. Attempted to notify pt and she states she is in the middle of something and will have to call us back.

## 2017-02-26 DIAGNOSIS — M150 Primary generalized (osteo)arthritis: Secondary | ICD-10-CM | POA: Diagnosis not present

## 2017-02-26 DIAGNOSIS — M4716 Other spondylosis with myelopathy, lumbar region: Secondary | ICD-10-CM | POA: Diagnosis not present

## 2017-02-26 DIAGNOSIS — M069 Rheumatoid arthritis, unspecified: Secondary | ICD-10-CM | POA: Diagnosis not present

## 2017-02-26 DIAGNOSIS — M47812 Spondylosis without myelopathy or radiculopathy, cervical region: Secondary | ICD-10-CM | POA: Diagnosis not present

## 2017-02-26 DIAGNOSIS — M3500 Sicca syndrome, unspecified: Secondary | ICD-10-CM | POA: Diagnosis not present

## 2017-02-26 DIAGNOSIS — M064 Inflammatory polyarthropathy: Secondary | ICD-10-CM | POA: Diagnosis not present

## 2017-03-03 DIAGNOSIS — H35363 Drusen (degenerative) of macula, bilateral: Secondary | ICD-10-CM | POA: Diagnosis not present

## 2017-03-03 DIAGNOSIS — H04121 Dry eye syndrome of right lacrimal gland: Secondary | ICD-10-CM | POA: Diagnosis not present

## 2017-03-03 DIAGNOSIS — D23112 Other benign neoplasm of skin of right lower eyelid, including canthus: Secondary | ICD-10-CM | POA: Diagnosis not present

## 2017-03-03 DIAGNOSIS — L820 Inflamed seborrheic keratosis: Secondary | ICD-10-CM | POA: Diagnosis not present

## 2017-03-03 DIAGNOSIS — H35373 Puckering of macula, bilateral: Secondary | ICD-10-CM | POA: Diagnosis not present

## 2017-03-03 DIAGNOSIS — H04122 Dry eye syndrome of left lacrimal gland: Secondary | ICD-10-CM | POA: Diagnosis not present

## 2017-03-05 DIAGNOSIS — K589 Irritable bowel syndrome without diarrhea: Secondary | ICD-10-CM | POA: Diagnosis not present

## 2017-03-05 DIAGNOSIS — E7800 Pure hypercholesterolemia, unspecified: Secondary | ICD-10-CM | POA: Diagnosis not present

## 2017-03-05 DIAGNOSIS — I10 Essential (primary) hypertension: Secondary | ICD-10-CM | POA: Diagnosis not present

## 2017-03-05 DIAGNOSIS — R51 Headache: Secondary | ICD-10-CM | POA: Diagnosis not present

## 2017-03-05 DIAGNOSIS — M810 Age-related osteoporosis without current pathological fracture: Secondary | ICD-10-CM | POA: Diagnosis not present

## 2017-03-10 ENCOUNTER — Telehealth: Payer: Self-pay | Admitting: Pulmonary Disease

## 2017-03-27 DIAGNOSIS — H04123 Dry eye syndrome of bilateral lacrimal glands: Secondary | ICD-10-CM | POA: Diagnosis not present

## 2017-04-15 DIAGNOSIS — M06041 Rheumatoid arthritis without rheumatoid factor, right hand: Secondary | ICD-10-CM | POA: Diagnosis not present

## 2017-04-21 DIAGNOSIS — M47812 Spondylosis without myelopathy or radiculopathy, cervical region: Secondary | ICD-10-CM | POA: Diagnosis not present

## 2017-04-21 DIAGNOSIS — M19031 Primary osteoarthritis, right wrist: Secondary | ICD-10-CM | POA: Diagnosis not present

## 2017-05-05 DIAGNOSIS — R51 Headache: Secondary | ICD-10-CM | POA: Diagnosis not present

## 2017-05-05 DIAGNOSIS — M549 Dorsalgia, unspecified: Secondary | ICD-10-CM | POA: Diagnosis not present

## 2017-05-05 DIAGNOSIS — K589 Irritable bowel syndrome without diarrhea: Secondary | ICD-10-CM | POA: Diagnosis not present

## 2017-05-05 DIAGNOSIS — I10 Essential (primary) hypertension: Secondary | ICD-10-CM | POA: Diagnosis not present

## 2017-05-05 DIAGNOSIS — E7800 Pure hypercholesterolemia, unspecified: Secondary | ICD-10-CM | POA: Diagnosis not present

## 2017-05-21 DIAGNOSIS — M810 Age-related osteoporosis without current pathological fracture: Secondary | ICD-10-CM | POA: Diagnosis not present

## 2017-05-21 DIAGNOSIS — M19042 Primary osteoarthritis, left hand: Secondary | ICD-10-CM | POA: Diagnosis not present

## 2017-05-21 DIAGNOSIS — M19041 Primary osteoarthritis, right hand: Secondary | ICD-10-CM | POA: Diagnosis not present

## 2017-05-21 DIAGNOSIS — M1990 Unspecified osteoarthritis, unspecified site: Secondary | ICD-10-CM | POA: Diagnosis not present

## 2017-05-21 DIAGNOSIS — I10 Essential (primary) hypertension: Secondary | ICD-10-CM | POA: Diagnosis not present

## 2017-06-05 DIAGNOSIS — D692 Other nonthrombocytopenic purpura: Secondary | ICD-10-CM | POA: Diagnosis not present

## 2017-06-05 DIAGNOSIS — B351 Tinea unguium: Secondary | ICD-10-CM | POA: Diagnosis not present

## 2017-06-05 DIAGNOSIS — L821 Other seborrheic keratosis: Secondary | ICD-10-CM | POA: Diagnosis not present

## 2017-06-05 DIAGNOSIS — L820 Inflamed seborrheic keratosis: Secondary | ICD-10-CM | POA: Diagnosis not present

## 2017-06-05 DIAGNOSIS — L570 Actinic keratosis: Secondary | ICD-10-CM | POA: Diagnosis not present

## 2017-06-05 DIAGNOSIS — D2261 Melanocytic nevi of right upper limb, including shoulder: Secondary | ICD-10-CM | POA: Diagnosis not present

## 2017-06-05 DIAGNOSIS — G548 Other nerve root and plexus disorders: Secondary | ICD-10-CM | POA: Diagnosis not present

## 2017-06-05 DIAGNOSIS — L299 Pruritus, unspecified: Secondary | ICD-10-CM | POA: Diagnosis not present

## 2017-06-05 DIAGNOSIS — D225 Melanocytic nevi of trunk: Secondary | ICD-10-CM | POA: Diagnosis not present

## 2017-06-05 DIAGNOSIS — L812 Freckles: Secondary | ICD-10-CM | POA: Diagnosis not present

## 2017-06-20 DIAGNOSIS — M19041 Primary osteoarthritis, right hand: Secondary | ICD-10-CM | POA: Diagnosis not present

## 2017-06-20 DIAGNOSIS — M810 Age-related osteoporosis without current pathological fracture: Secondary | ICD-10-CM | POA: Diagnosis not present

## 2017-06-20 DIAGNOSIS — M19042 Primary osteoarthritis, left hand: Secondary | ICD-10-CM | POA: Diagnosis not present

## 2017-06-20 DIAGNOSIS — I10 Essential (primary) hypertension: Secondary | ICD-10-CM | POA: Diagnosis not present

## 2017-06-20 DIAGNOSIS — M1990 Unspecified osteoarthritis, unspecified site: Secondary | ICD-10-CM | POA: Diagnosis not present

## 2017-06-26 DIAGNOSIS — M25561 Pain in right knee: Secondary | ICD-10-CM | POA: Diagnosis not present

## 2017-06-26 DIAGNOSIS — I10 Essential (primary) hypertension: Secondary | ICD-10-CM | POA: Diagnosis not present

## 2017-06-26 DIAGNOSIS — K589 Irritable bowel syndrome without diarrhea: Secondary | ICD-10-CM | POA: Diagnosis not present

## 2017-06-26 DIAGNOSIS — M25569 Pain in unspecified knee: Secondary | ICD-10-CM | POA: Diagnosis not present

## 2017-06-26 DIAGNOSIS — E7800 Pure hypercholesterolemia, unspecified: Secondary | ICD-10-CM | POA: Diagnosis not present

## 2017-06-26 DIAGNOSIS — R6884 Jaw pain: Secondary | ICD-10-CM | POA: Diagnosis not present

## 2017-07-08 DIAGNOSIS — M06041 Rheumatoid arthritis without rheumatoid factor, right hand: Secondary | ICD-10-CM | POA: Diagnosis not present

## 2017-07-16 DIAGNOSIS — Z1231 Encounter for screening mammogram for malignant neoplasm of breast: Secondary | ICD-10-CM | POA: Diagnosis not present

## 2017-07-18 DIAGNOSIS — M19041 Primary osteoarthritis, right hand: Secondary | ICD-10-CM | POA: Diagnosis not present

## 2017-07-18 DIAGNOSIS — M1990 Unspecified osteoarthritis, unspecified site: Secondary | ICD-10-CM | POA: Diagnosis not present

## 2017-07-18 DIAGNOSIS — M810 Age-related osteoporosis without current pathological fracture: Secondary | ICD-10-CM | POA: Diagnosis not present

## 2017-07-18 DIAGNOSIS — I10 Essential (primary) hypertension: Secondary | ICD-10-CM | POA: Diagnosis not present

## 2017-07-18 DIAGNOSIS — M19042 Primary osteoarthritis, left hand: Secondary | ICD-10-CM | POA: Diagnosis not present

## 2017-07-30 DIAGNOSIS — H35373 Puckering of macula, bilateral: Secondary | ICD-10-CM | POA: Diagnosis not present

## 2017-07-30 DIAGNOSIS — H35363 Drusen (degenerative) of macula, bilateral: Secondary | ICD-10-CM | POA: Diagnosis not present

## 2017-07-30 DIAGNOSIS — H04123 Dry eye syndrome of bilateral lacrimal glands: Secondary | ICD-10-CM | POA: Diagnosis not present

## 2017-08-12 ENCOUNTER — Telehealth: Payer: Self-pay | Admitting: *Deleted

## 2017-08-12 NOTE — Telephone Encounter (Signed)
Spoke to pt and she was in the car/she will call back to schedule appt/thx dmf

## 2017-08-12 NOTE — Telephone Encounter (Signed)
Copied from [place] [phone]. Topic: Inquiry >> Aug 12, 2017  9:48 AM Bea Graff, NT wrote: Reason for CRM: Pt would like to transfer care from Dr. Birdie Riddle to Claudie Fisherman for location. She is looking to switch to either Dr. Deborra Medina or Wilfred Lacy.

## 2017-08-12 NOTE — Telephone Encounter (Signed)
Okay with me 

## 2017-08-12 NOTE — Telephone Encounter (Signed)
Ok to transfer. 

## 2017-08-13 ENCOUNTER — Ambulatory Visit (INDEPENDENT_AMBULATORY_CARE_PROVIDER_SITE_OTHER): Payer: Medicare Other | Admitting: Family Medicine

## 2017-08-13 ENCOUNTER — Encounter: Payer: Self-pay | Admitting: Family Medicine

## 2017-08-13 VITALS — BP 110/80 | HR 64 | Ht 64.75 in | Wt 149.4 lb

## 2017-08-13 DIAGNOSIS — I10 Essential (primary) hypertension: Secondary | ICD-10-CM

## 2017-08-13 DIAGNOSIS — E782 Mixed hyperlipidemia: Secondary | ICD-10-CM

## 2017-08-13 DIAGNOSIS — M810 Age-related osteoporosis without current pathological fracture: Secondary | ICD-10-CM | POA: Diagnosis not present

## 2017-08-13 DIAGNOSIS — K219 Gastro-esophageal reflux disease without esophagitis: Secondary | ICD-10-CM

## 2017-08-13 DIAGNOSIS — M26621 Arthralgia of right temporomandibular joint: Secondary | ICD-10-CM

## 2017-08-13 DIAGNOSIS — M1990 Unspecified osteoarthritis, unspecified site: Secondary | ICD-10-CM | POA: Diagnosis not present

## 2017-08-13 DIAGNOSIS — M069 Rheumatoid arthritis, unspecified: Secondary | ICD-10-CM

## 2017-08-13 DIAGNOSIS — M19042 Primary osteoarthritis, left hand: Secondary | ICD-10-CM | POA: Diagnosis not present

## 2017-08-13 DIAGNOSIS — M19041 Primary osteoarthritis, right hand: Secondary | ICD-10-CM | POA: Diagnosis not present

## 2017-08-13 LAB — COMPREHENSIVE METABOLIC PANEL
ALBUMIN: 4.3 g/dL (ref 3.5–5.2)
ALT: 21 U/L (ref 0–35)
AST: 16 U/L (ref 0–37)
Alkaline Phosphatase: 82 U/L (ref 39–117)
BILIRUBIN TOTAL: 0.4 mg/dL (ref 0.2–1.2)
BUN: 16 mg/dL (ref 6–23)
CALCIUM: 9.8 mg/dL (ref 8.4–10.5)
CO2: 31 meq/L (ref 19–32)
CREATININE: 0.75 mg/dL (ref 0.40–1.20)
Chloride: 101 mEq/L (ref 96–112)
GFR: 78.71 mL/min (ref >60.00)
Glucose, Bld: 118 mg/dL — ABNORMAL HIGH (ref 70–99)
Potassium: 5.6 mEq/L — ABNORMAL HIGH (ref 3.5–5.1)
Sodium: 138 mEq/L (ref 135–145)
Total Protein: 6.5 g/dL (ref 6.0–8.3)

## 2017-08-13 LAB — LIPID PANEL
CHOL/HDL RATIO: 2
CHOLESTEROL: 118 mg/dL (ref 0–200)
HDL: 75.4 mg/dL (ref >39.00)
LDL Cholesterol: 31 mg/dL (ref 0–99)
NonHDL: 42.46
TRIGLYCERIDES: 59 mg/dL (ref 0.0–149.0)
VLDL: 11.8 mg/dL (ref 0.0–40.0)

## 2017-08-13 LAB — CBC
HEMATOCRIT: 39.2 % (ref 36.0–46.0)
HEMOGLOBIN: 13.2 g/dL (ref 12.0–15.0)
MCHC: 33.8 g/dL (ref 30.0–36.0)
MCV: 88.8 fl (ref 78.0–100.0)
PLATELETS: 323 10*3/uL (ref 150.0–400.0)
RBC: 4.41 Mil/uL (ref 3.87–5.11)
RDW: 14.2 % (ref 11.5–15.5)
WBC: 6.2 10*3/uL (ref 4.0–10.5)

## 2017-08-13 LAB — TSH: TSH: 2.65 u[IU]/mL (ref 0.35–4.50)

## 2017-08-13 NOTE — Assessment & Plan Note (Signed)
Well-controlled with current medication. She will continue to follow with rheumatology as well as orthopedics as needed.

## 2017-08-13 NOTE — Progress Notes (Signed)
Subjective:    Patient ID: Jennifer Boone, female    DOB: 29-Mar-1934, 84 y.o.   MRN: [id number]  Chief Complaint  Patient presents with  . New Patient (Initial Visit)    F/u OA, HTN, HLD  '  HPI Jennifer Boone is a 84 y.o. female here today to establish with new pcp and follow up of chronic medical conditions.  She lives in [place] ~6 months of the year and spends the remainder of the year in [place], [place].  She is followed by a PCP in [place] as well, Dr. Rocco Serene.    -HTN:  Current tx with propranolol.  She does me she is doing well with his medication and denies any side effects including symptoms of hypotension or bradycardia.  She does follow a fairly healthy diet that is low in salt.  She has not had any chest pain, shortness of breath, palpitations or vision changes.  -Hyperlipidemia: Current treatment with simvastatin 20 mg.  She is tolerating this well and denies any myalgias.  Previous LDL of 38 in April 2018.  -Rheumatoid arthritis: Treated with methotrexate, reports symptoms have been fairly well controlled.  She does have some pain in her hands from time to time and receives injections from her orthopedist Dr. Len Childs.  She also tells me she is having some popping in the right side of her jaw at times.  She denies any significant pain associated with this.  She has established with a rheumatologist at [facility] rheumatology as well.  -GERD: Well-controlled with ranitidine.  Denies abdominal pain, nausea or melena.   Review of Systems Review of systems per HPI, otherwise negative.    Past Medical History:  Diagnosis Date  . Allergy   . Arrhythmia   . Arthritis   . GERD (gastroesophageal reflux disease)   . Hyperlipidemia   . Hypertension   . IBS (irritable bowel syndrome)   . 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 ABDOMINAL HYSTERECTOMY    . APPENDECTOMY    . BOWEL RESECTION    . CATARACT EXTRACTION    .  CHOLECYSTECTOMY    . HEMORROIDECTOMY    . ROTATOR CUFF REPAIR    . TONSILLECTOMY          Objective:   Physical Exam  Constitutional: She appears well-nourished. No distress.  HENT:  Head: Normocephalic and atraumatic.  Mouth/Throat: Oropharynx is clear and moist.  Mild crepitus along right TMJ without tenderness   Eyes: No scleral icterus.  Neck: Neck supple.  Cardiovascular: Normal rate, regular rhythm and normal heart sounds.  Pulmonary/Chest: Effort normal and breath sounds normal.  Musculoskeletal: She exhibits no edema.  Mild deformity of hands, no swelling or tenderness at this time   Lymphadenopathy:    She has no cervical adenopathy.  Neurological: She is alert.  Psychiatric: She has a normal mood and affect. Her behavior is normal.          Assessment & Plan:  Essential hypertension Blood pressure is well controlled. Continue propranolol. She will continue to follow a low-salt diet. Update metabolic panel, CBC and TSH.  GERD Well-controlled with ranitidine, recommend continuation.  Arthralgia of right temporomandibular joint Denies pain at this time, we will continue to monitor clinically.  Rheumatoid arthritis ([place]) Well-controlled with current medication. She will continue to follow with rheumatology as well as orthopedics as needed.  Mixed hyperlipidemia Tolerating statin well, continue. Update lipid panel  today.

## 2017-08-13 NOTE — Assessment & Plan Note (Signed)
Tolerating statin well, continue. Update lipid panel today.

## 2017-08-13 NOTE — Assessment & Plan Note (Signed)
Denies pain at this time, we will continue to monitor clinically.

## 2017-08-13 NOTE — Assessment & Plan Note (Signed)
Blood pressure is well controlled. Continue propranolol. She will continue to follow a low-salt diet. Update metabolic panel, CBC and TSH.

## 2017-08-13 NOTE — Patient Instructions (Signed)
It was very nice to meet you today Follow up annually or as needed.  Temporomandibular Joint Syndrome Temporomandibular joint (TMJ) syndrome is a condition that affects the joints between your jaw and your skull. The TMJs are located near your ears and allow your jaw to open and close. These joints and the nearby muscles are involved in all movements of the jaw. People with TMJ syndrome have pain in the area of these joints and muscles. Chewing, biting, or other movements of the jaw can be difficult or painful. TMJ syndrome can be caused by various things. In many cases, the condition is mild and goes away within a few weeks. For some people, the condition can become a long-term problem. What are the causes? Possible causes of TMJ syndrome include:  Grinding your teeth or clenching your jaw. Some people do this when they are under stress.  Arthritis.  Injury to the jaw.  Head or neck injury.  Teeth or dentures that are not aligned well.  In some cases, the cause of TMJ syndrome may not be known. What are the signs or symptoms? The most common symptom is an aching pain on the side of the head in the area of the TMJ. Other symptoms may include:  Pain when moving your jaw, such as when chewing or biting.  Being unable to open your jaw all the way.  Making a clicking sound when you open your mouth.  Headache.  Earache.  Neck or shoulder pain.  How is this diagnosed? Diagnosis can usually be made based on your symptoms, your medical history, and a physical exam. Your health care provider may check the range of motion of your jaw. Imaging tests, such as X-rays or an MRI, are sometimes done. You may need to see your dentist to determine if your teeth and jaw are lined up correctly. How is this treated? TMJ syndrome often goes away on its own. If treatment is needed, the options may include:  Eating soft foods and applying ice or heat.  Medicines to relieve pain or  inflammation.  Medicines to relax the muscles.  A splint, bite plate, or mouthpiece to prevent teeth grinding or jaw clenching.  Relaxation techniques or counseling to help reduce stress.  Transcutaneous electrical nerve stimulation (TENS). This helps to relieve pain by applying an electrical current through the skin.  Acupuncture. This is sometimes helpful to relieve pain.  Jaw surgery. This is rarely needed.  Follow these instructions at home:  Take medicines only as directed by your health care provider.  Eat a soft diet if you are having trouble chewing.  Apply ice to the painful area. ? Put ice in a plastic bag. ? Place a towel between your skin and the bag. ? Leave the ice on for 20 minutes, 2-3 times a day.  Apply a warm compress to the painful area as directed.  Massage your jaw area and perform any jaw stretching exercises as recommended by your health care provider.  If you were given a mouthpiece or bite plate, wear it as directed.  Avoid foods that require a lot of chewing. Do not chew gum.  Keep all follow-up visits as directed by your health care provider. This is important. Contact a health care provider if:  You are having trouble eating.  You have new or worsening symptoms. Get help right away if:  Your jaw locks open or closed. This information is not intended to replace advice given to you by your health care  provider. Make sure you discuss any questions you have with your health care provider. Document Released: 01/08/2001 Document Revised: 12/14/2015 Document Reviewed: 11/18/2013 Elsevier Interactive Patient Education  [employer].

## 2017-08-13 NOTE — Assessment & Plan Note (Signed)
Well-controlled with ranitidine, recommend continuation.

## 2017-08-14 ENCOUNTER — Other Ambulatory Visit: Payer: Self-pay | Admitting: Family Medicine

## 2017-08-14 ENCOUNTER — Other Ambulatory Visit (INDEPENDENT_AMBULATORY_CARE_PROVIDER_SITE_OTHER): Payer: Medicare Other

## 2017-08-14 DIAGNOSIS — E875 Hyperkalemia: Secondary | ICD-10-CM

## 2017-08-14 LAB — BASIC METABOLIC PANEL
BUN: 16 mg/dL (ref 7–25)
CALCIUM: 9.6 mg/dL (ref 8.6–10.4)
CHLORIDE: 101 mmol/L (ref 98–110)
CO2: 28 mmol/L (ref 20–32)
Creat: 0.67 mg/dL (ref 0.60–0.88)
Glucose, Bld: 99 mg/dL (ref 65–99)
Potassium: 4.6 mmol/L (ref 3.5–5.3)
SODIUM: 135 mmol/L (ref 135–146)

## 2017-08-14 NOTE — Addendum Note (Signed)
Addended by: Diona Foley on: 08/14/2017 02:51 PM   Modules accepted: Orders

## 2017-08-18 ENCOUNTER — Ambulatory Visit (INDEPENDENT_AMBULATORY_CARE_PROVIDER_SITE_OTHER): Payer: Medicare Other | Admitting: Family Medicine

## 2017-08-18 ENCOUNTER — Encounter: Payer: Self-pay | Admitting: Family Medicine

## 2017-08-18 VITALS — BP 110/70 | HR 67 | Temp 97.9°F | Wt 150.2 lb

## 2017-08-18 DIAGNOSIS — J029 Acute pharyngitis, unspecified: Secondary | ICD-10-CM

## 2017-08-18 DIAGNOSIS — J301 Allergic rhinitis due to pollen: Secondary | ICD-10-CM

## 2017-08-18 NOTE — Progress Notes (Signed)
Chief Complaint  Patient presents with  . Sore Throat  . Sinus Problem    Subjective:     Jennifer Boone is a 84 y.o. female who presents for evaluation of sore throat. Associated symptoms include nasal blockage, post nasal drip, sinus and nasal congestion and sore throat. Onset of symptoms was 3 days ago, and have been unchanged since that time. She is drinking plenty of fluids. She has not had a recent close exposure to someone with proven streptococcal pharyngitis.  The following portions of the patient's history were reviewed and updated as appropriate: allergies, current medications, past family history, past medical history, past social history, past surgical history and problem list.  Review of Systems Constitutional: negative for chills, fatigue and fevers Eyes: negative for irritation and redness Ears, nose, mouth, throat, and face: positive for nasal congestion and sore throat Respiratory: negative for wheezing Cardiovascular: negative for chest pain Integument/breast: negative for rash  Additional ROS are negative.   Objective:    BP 110/70 (BP Location: Right Arm, Patient Position: Sitting, Cuff Size: Normal)   Pulse 67   Temp 97.9 F (36.6 C) (Oral)   Wt 150 lb 3.2 oz (68.1 kg)   SpO2 96%   BMI 25.19 kg/m  Physical Exam  Constitutional: She is oriented to person, place, and time. She appears well-nourished. She does not appear ill.  HENT:  Head: Normocephalic and atraumatic.  Right Ear: Tympanic membrane and ear canal normal.  Left Ear: Tympanic membrane and ear canal normal.  Mouth/Throat: Mucous membranes are normal. No oral lesions. No uvula swelling. Posterior oropharyngeal erythema (mild) present. No tonsillar exudate.  Cardiovascular: Normal rate, regular rhythm and normal heart sounds.  Pulmonary/Chest: Effort normal and breath sounds normal. She has no wheezes.  Neurological: She is alert and oriented to person, place, and time.  Skin: No rash noted.   Psychiatric: She has a normal mood and affect. Her behavior is normal.     Assessment:     Acute Pharyngitis, likely  Rhinitis with post nasal drip    Plan:   Allergic rhinitis Pharyngitis likely 2/2 to allergic rhinitis with post nasal drainage.  Has not done well with steroid nasal sprays in the past.  Recommend that she start antihistamine and use saline nasal spray.  Call for worsening symptoms.

## 2017-08-18 NOTE — Patient Instructions (Signed)
Use nasal saline as needed Try Allegra (fexofenadine), Zyrtec (cetirizine) or Claritin (loratadine) Call if worsening  Allergic Rhinitis, Adult Allergic rhinitis is an allergic reaction that affects the mucous membrane inside the nose. It causes sneezing, a runny or stuffy nose, and the feeling of mucus going down the back of the throat (postnasal drip). Allergic rhinitis can be mild to severe. There are two types of allergic rhinitis:  Seasonal. This type is also called hay fever. It happens only during certain seasons.  Perennial. This type can happen at any time of the year.  What are the causes? This condition happens when the body's defense system (immune system) responds to certain harmless substances called allergens as though they were germs.  Seasonal allergic rhinitis is triggered by pollen, which can come from grasses, trees, and weeds. Perennial allergic rhinitis may be caused by:  House dust mites.  Pet dander.  Mold spores.  What are the signs or symptoms? Symptoms of this condition include:  Sneezing.  Runny or stuffy nose (nasal congestion).  Postnasal drip.  Itchy nose.  Tearing of the eyes.  Trouble sleeping.  Daytime sleepiness.  How is this diagnosed? This condition may be diagnosed based on:  Your medical history.  A physical exam.  Tests to check for related conditions, such as: ? Asthma. ? Pink eye. ? Ear infection. ? Upper respiratory infection.  Tests to find out which allergens trigger your symptoms. These may include skin or blood tests.  How is this treated? There is no cure for this condition, but treatment can help control symptoms. Treatment may include:  Taking medicines that block allergy symptoms, such as antihistamines. Medicine may be given as a shot, nasal spray, or pill.  Avoiding the allergen.  Desensitization. This treatment involves getting ongoing shots until your body becomes less sensitive to the allergen. This  treatment may be done if other treatments do not help.  If taking medicine and avoiding the allergen does not work, new, stronger medicines may be prescribed.  Follow these instructions at home:  Find out what you are allergic to. Common allergens include smoke, dust, and pollen.  Avoid the things you are allergic to. These are some things you can do to help avoid allergens: ? Replace carpet with wood, tile, or vinyl flooring. Carpet can trap dander and dust. ? Do not smoke. Do not allow smoking in your home. ? Change your heating and air conditioning filter at least once a month. ? During allergy season:  Keep windows closed as much as possible.  Plan outdoor activities when pollen counts are lowest. This is usually during the evening hours.  When coming indoors, change clothing and shower before sitting on furniture or bedding.  Take over-the-counter and prescription medicines only as told by your health care provider.  Keep all follow-up visits as told by your health care provider. This is important. Contact a health care provider if:  You have a fever.  You develop a persistent cough.  You make whistling sounds when you breathe (you wheeze).  Your symptoms interfere with your normal daily activities. Get help right away if:  You have shortness of breath. Summary  This condition can be managed by taking medicines as directed and avoiding allergens.  Contact your health care provider if you develop a persistent cough or fever.  During allergy season, keep windows closed as much as possible. This information is not intended to replace advice given to you by your health care provider. Make sure  you discuss any questions you have with your health care provider. Document Released: 01/08/2001 Document Revised: 05/23/2016 Document Reviewed: 05/23/2016 Elsevier Interactive Patient Education  [employer].

## 2017-08-18 NOTE — Assessment & Plan Note (Signed)
Pharyngitis likely 2/2 to allergic rhinitis with post nasal drainage.  Has not done well with steroid nasal sprays in the past.  Recommend that she start antihistamine and use saline nasal spray.  Call for worsening symptoms.

## 2017-08-20 DIAGNOSIS — M1811 Unilateral primary osteoarthritis of first carpometacarpal joint, right hand: Secondary | ICD-10-CM | POA: Diagnosis not present

## 2017-08-20 DIAGNOSIS — M1812 Unilateral primary osteoarthritis of first carpometacarpal joint, left hand: Secondary | ICD-10-CM | POA: Diagnosis not present

## 2017-08-20 DIAGNOSIS — M25532 Pain in left wrist: Secondary | ICD-10-CM | POA: Diagnosis not present

## 2017-08-20 DIAGNOSIS — M25531 Pain in right wrist: Secondary | ICD-10-CM | POA: Diagnosis not present

## 2017-08-25 ENCOUNTER — Encounter: Payer: Self-pay | Admitting: Family Medicine

## 2017-08-25 ENCOUNTER — Ambulatory Visit (INDEPENDENT_AMBULATORY_CARE_PROVIDER_SITE_OTHER): Payer: Medicare Other | Admitting: Family Medicine

## 2017-08-25 DIAGNOSIS — B9689 Other specified bacterial agents as the cause of diseases classified elsewhere: Secondary | ICD-10-CM

## 2017-08-25 DIAGNOSIS — J019 Acute sinusitis, unspecified: Secondary | ICD-10-CM | POA: Diagnosis not present

## 2017-08-25 MED ORDER — AZITHROMYCIN 250 MG PO TABS: ORAL_TABLET | ORAL | 0 refills | Status: DC

## 2017-08-25 NOTE — Progress Notes (Signed)
Jennifer Boone - 84 y.o. female MRN [id number]  Date of birth: December 20, 1933  Subjective Chief Complaint  Patient presents with  . Cough    nose bleeds in the AM     Cough  This is a new problem. Episode onset: 3-4 days ago. The problem has been waxing and waning. The problem occurs every few minutes. The cough is productive of sputum. Associated symptoms include nasal congestion (blood tinged, purulent nasal discharge.), postnasal drip and rhinorrhea (blood tinged, purulent nasal discharge). Pertinent negatives include no chest pain, chills, ear congestion, ear pain, fever, headaches, heartburn, hemoptysis, myalgias, rash, sore throat, shortness of breath, sweats, weight loss or wheezing. Associated symptoms comments: Mild sinus pressure/pain . The symptoms are aggravated by pollens. Treatments tried: antihistamine, nasal saline. The treatment provided mild relief. Her past medical history is significant for environmental allergies.    Review of Systems  Constitutional: Negative for chills, fever and weight loss.  HENT: Positive for postnasal drip and rhinorrhea (blood tinged, purulent nasal discharge). Negative for ear pain and sore throat.   Respiratory: Positive for cough. Negative for hemoptysis, shortness of breath and wheezing.   Cardiovascular: Negative for chest pain.  Gastrointestinal: Negative for heartburn.  Musculoskeletal: Negative for myalgias.  Skin: Negative for rash.  Neurological: Negative for headaches.  Endo/Heme/Allergies: Positive for environmental allergies.   Allergies  Allergen Reactions  . Celebrex [Celecoxib]     Hives, throat closes  . Tape     Hives, cannot tolerate surgical tape.   . Amoxicillin     REACTION: hives  . Cefuroxime Axetil Hives  . Clindamycin     REACTION: hives  . Codeine     Cannot remember, bad reaction  . Grass Extracts [Gramineae Pollens]     Lab tests positive  . Tetracycline     REACTION: GI upset  . Tree Extract     Lab  positive    Past Medical History:  Diagnosis Date  . Allergy   . Arrhythmia   . Arthritis   . GERD (gastroesophageal reflux disease)   . Hyperlipidemia   . Hypertension   . IBS (irritable bowel syndrome)   . Rheumatic fever    Childhood  . 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 ABDOMINAL HYSTERECTOMY    . APPENDECTOMY    . BOWEL RESECTION    . CATARACT EXTRACTION    . CHOLECYSTECTOMY    . HEMORROIDECTOMY    . ROTATOR CUFF REPAIR    . TONSILLECTOMY      Social History   Socioeconomic History  . Marital status: Married    Spouse name: Not on file  . Number of children: Not on file  . Years of education: Not on file  . Highest education level: Not on file  Occupational History  . Not on file  Social Needs  . Financial resource strain: Not on file  . Food insecurity:    Worry: Not on file    Inability: Not on file  . Transportation needs:    Medical: Not on file    Non-medical: Not on file  Tobacco Use  . Smoking status: Former Smoker    Types: Cigarettes    Last attempt to quit: 04/29/1976    Years since quitting: 41.3  . Smokeless tobacco: Never Used  Substance and Sexual Activity  . Alcohol use: No  . Drug use: No  . Sexual activity: Never  Lifestyle  .  Physical activity:    Days per week: Not on file    Minutes per session: Not on file  . Stress: Not on file  Relationships  . Social connections:    Talks on phone: Not on file    Gets together: Not on file    Attends religious service: Not on file    Active member of club or organization: Not on file    Attends meetings of clubs or organizations: Not on file    Relationship status: Not on file  Other Topics Concern  . Not on file  Social History Narrative  . Not on file    Family History  Problem Relation Age of Onset  . Heart disease Mother   . Diabetes Sister   . Heart disease Sister     Health Maintenance  Topic Date Due  .  Samul Dada  12/31/1954  . PNA vac Low Risk Adult (1 of 2 - PCV13) 12/30/2000  . INFLUENZA VACCINE  11/27/2017  . DEXA SCAN  Completed    ----------------------------------------------------------------------------------------------------------------------------------------------------------------------------------------------------------------- Physical Exam BP 122/80 (BP Location: Right Arm, Patient Position: Sitting, Cuff Size: Normal)   Pulse 67   Temp 98.7 F (37.1 C) (Oral)   Wt 147 lb 12.8 oz (67 kg)   SpO2 99%   BMI 24.79 kg/m   Physical Exam  Constitutional: She is oriented to person, place, and time. She appears well-nourished. No distress.  HENT:  Head: Normocephalic and atraumatic.  Right Ear: Tympanic membrane normal.  Left Ear: Tympanic membrane normal.  Nose: Right sinus exhibits maxillary sinus tenderness. Right sinus exhibits no frontal sinus tenderness. Left sinus exhibits frontal sinus tenderness. Left sinus exhibits no maxillary sinus tenderness.  Mouth/Throat: Oropharynx is clear and moist.  Uvula is edematous   Eyes: Conjunctivae are normal. No scleral icterus.  Neck: Neck supple. No thyromegaly present.  Cardiovascular: Normal rate, regular rhythm and normal heart sounds.  Pulmonary/Chest: Effort normal and breath sounds normal. No respiratory distress. She has no wheezes.  Lymphadenopathy:    She has no cervical adenopathy.  Neurological: She is alert and oriented to person, place, and time.  Skin: No rash noted.  Psychiatric: She has a normal mood and affect. Her behavior is normal.    ------------------------------------------------------------------------------------------------------------------------------------------------------------------------------------------------------------------- Assessment and Plan  Acute bacterial sinusitis New problem Increased fluid intake Rx for azithromycin Continue nasal saline and antihistamine Call for  worsening symptoms or if fails to improve with current treatment.

## 2017-08-25 NOTE — Patient Instructions (Signed)
Start azithromycin Continue to push fluids Continue nasal saline Continue claritin Call if symptoms are not improving or continue to worsen

## 2017-08-25 NOTE — Assessment & Plan Note (Signed)
New problem Increased fluid intake Rx for azithromycin Continue nasal saline and antihistamine Call for worsening symptoms or if fails to improve with current treatment.

## 2017-08-29 ENCOUNTER — Telehealth: Payer: Self-pay | Admitting: Family Medicine

## 2017-08-29 NOTE — Telephone Encounter (Signed)
Attempted to call patient to schedule AWV. Patient did not answer will try to call patient at a later time. SF °

## 2017-09-17 DIAGNOSIS — M79641 Pain in right hand: Secondary | ICD-10-CM | POA: Diagnosis not present

## 2017-09-17 DIAGNOSIS — M19041 Primary osteoarthritis, right hand: Secondary | ICD-10-CM | POA: Diagnosis not present

## 2017-09-17 DIAGNOSIS — R2241 Localized swelling, mass and lump, right lower limb: Secondary | ICD-10-CM | POA: Diagnosis not present

## 2017-10-06 DIAGNOSIS — Z6825 Body mass index (BMI) 25.0-25.9, adult: Secondary | ICD-10-CM | POA: Diagnosis not present

## 2017-10-06 DIAGNOSIS — M06041 Rheumatoid arthritis without rheumatoid factor, right hand: Secondary | ICD-10-CM | POA: Diagnosis not present

## 2017-10-06 DIAGNOSIS — M2550 Pain in unspecified joint: Secondary | ICD-10-CM | POA: Diagnosis not present

## 2017-10-06 DIAGNOSIS — H04123 Dry eye syndrome of bilateral lacrimal glands: Secondary | ICD-10-CM | POA: Diagnosis not present

## 2017-10-06 DIAGNOSIS — M06042 Rheumatoid arthritis without rheumatoid factor, left hand: Secondary | ICD-10-CM | POA: Diagnosis not present

## 2017-10-06 DIAGNOSIS — E663 Overweight: Secondary | ICD-10-CM | POA: Diagnosis not present

## 2017-10-09 ENCOUNTER — Ambulatory Visit (INDEPENDENT_AMBULATORY_CARE_PROVIDER_SITE_OTHER): Payer: Medicare Other | Admitting: Family Medicine

## 2017-10-09 ENCOUNTER — Other Ambulatory Visit: Payer: Self-pay | Admitting: Family Medicine

## 2017-10-09 ENCOUNTER — Encounter: Payer: Self-pay | Admitting: Family Medicine

## 2017-10-09 DIAGNOSIS — M62830 Muscle spasm of back: Secondary | ICD-10-CM | POA: Insufficient documentation

## 2017-10-09 MED ORDER — BACLOFEN 10 MG PO TABS: 10.0000 mg | ORAL_TABLET | Freq: Three times a day (TID) | ORAL | 0 refills | Status: DC | PRN

## 2017-10-09 NOTE — Patient Instructions (Signed)

## 2017-10-09 NOTE — Progress Notes (Signed)
Jennifer Boone - 84 y.o. female MRN [id number]  Date of birth: [birth year]  Subjective Chief Complaint  Patient presents with  . Back Pain    pain jumping from hips,waist and back/2 days/ aleve and tylenol/ pt is active--walking// gas?    HPI Jennifer Boone is a 84 y.o. female here today for same day visit with complaint of back pain.  Reports that symptoms began a few days ago. Initially started in hips then moved to L side of lower back, radiating to mid back on L last night.  Recently started walking more.   Took two ES tylenol  And used heating pain with improvement today.  Denies radiation of pain or weakness into legs, fever or chills, pain with urination, or changes to bowels.  She has been a little more gassy lately, denies nausea.  ROS:  ROS completed and negative except as noted per HPI     Allergies  Allergen Reactions  . Celebrex [Celecoxib]     Hives, throat closes  . Tape     Hives, cannot tolerate surgical tape.   . Amoxicillin     REACTION: hives  . Cefuroxime Axetil Hives  . Clindamycin     REACTION: hives  . Codeine     Cannot remember, bad reaction  . Grass Extracts [Gramineae Pollens]     Lab tests positive  . Tetracycline     REACTION: GI upset  . Tree Extract     Lab positive    Past Medical History:  Diagnosis Date  . Allergy   . Arrhythmia   . Arthritis   . GERD (gastroesophageal reflux disease)   . Hyperlipidemia   . Hypertension   . IBS (irritable bowel syndrome)   . Rheumatic fever    Childhood  . 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 ABDOMINAL HYSTERECTOMY    . APPENDECTOMY    . BOWEL RESECTION    . CATARACT EXTRACTION    . CHOLECYSTECTOMY    . HEMORROIDECTOMY    . ROTATOR CUFF REPAIR    . TONSILLECTOMY      Social History   Socioeconomic History  . Marital status: Married    Spouse name: Not on file  . Number of children: Not on file  . Years of education: Not on file    . Highest education level: Not on file  Occupational History  . Not on file  Social Needs  . Financial resource strain: Not on file  . Food insecurity:    Worry: Not on file    Inability: Not on file  . Transportation needs:    Medical: Not on file    Non-medical: Not on file  Tobacco Use  . Smoking status: Former Smoker    Types: Cigarettes    Last attempt to quit: 04/29/1976    Years since quitting: 41.4  . Smokeless tobacco: Never Used  Substance and Sexual Activity  . Alcohol use: No  . Drug use: No  . Sexual activity: Never  Lifestyle  . Physical activity:    Days per week: Not on file    Minutes per session: Not on file  . Stress: Not on file  Relationships  . Social connections:    Talks on phone: Not on file    Gets together: Not on file    Attends religious service: Not on file    Active member of club or organization: Not  on file    Attends meetings of clubs or organizations: Not on file    Relationship status: Not on file  Other Topics Concern  . Not on file  Social History Narrative  . Not on file    Family History  Problem Relation Age of Onset  . Heart disease Mother   . Diabetes Sister   . Heart disease Sister     Health Maintenance  Topic Date Due  . Samul Dada  12/31/1954  . PNA vac Low Risk Adult (1 of 2 - PCV13) 12/30/2000  . INFLUENZA VACCINE  11/27/2017  . DEXA SCAN  Completed    ----------------------------------------------------------------------------------------------------------------------------------------------------------------------------------------------------------------- Physical Exam BP 126/78   Pulse 62   Temp 98 F (36.7 C) (Oral)   Ht 5' 4.75" (1.645 m)   Wt 152 lb 6.4 oz (69.1 kg)   SpO2 97%   BMI 25.56 kg/m   Physical Exam  Constitutional: She is oriented to person, place, and time. She appears well-nourished. No distress.  HENT:  Head: Normocephalic and atraumatic.  Mouth/Throat: Oropharynx is clear  and moist.  Neck: Normal range of motion.  Cardiovascular: Normal rate, regular rhythm and normal heart sounds.  Pulmonary/Chest: Effort normal and breath sounds normal.  Musculoskeletal:  Lumbar and thoracic spine ROM is normal.  Mild TTP along L lower lumbar paraspinals ROM of hips is normal without pain.   Neurological: She is alert and oriented to person, place, and time.  Psychiatric: She has a normal mood and affect. Her behavior is normal.    ------------------------------------------------------------------------------------------------------------------------------------------------------------------------------------------------------------------- Assessment and Plan  Muscle spasm of back May continue tylenol and heat as needed rx for baclofen to use as needed for spasm May continue activity as tolerated If not improving she interested in OMT with Dr. Paulla Fore.

## 2017-10-09 NOTE — Assessment & Plan Note (Signed)
May continue tylenol and heat as needed rx for baclofen to use as needed for spasm May continue activity as tolerated If not improving she interested in OMT with Dr. Paulla Fore.

## 2017-10-14 DIAGNOSIS — L820 Inflamed seborrheic keratosis: Secondary | ICD-10-CM | POA: Diagnosis not present

## 2017-10-14 DIAGNOSIS — D2239 Melanocytic nevi of other parts of face: Secondary | ICD-10-CM | POA: Diagnosis not present

## 2017-10-14 DIAGNOSIS — B353 Tinea pedis: Secondary | ICD-10-CM | POA: Diagnosis not present

## 2017-10-14 DIAGNOSIS — D225 Melanocytic nevi of trunk: Secondary | ICD-10-CM | POA: Diagnosis not present

## 2017-10-14 DIAGNOSIS — L814 Other melanin hyperpigmentation: Secondary | ICD-10-CM | POA: Diagnosis not present

## 2017-10-16 ENCOUNTER — Other Ambulatory Visit: Payer: Self-pay | Admitting: Family Medicine

## 2017-10-20 ENCOUNTER — Ambulatory Visit (INDEPENDENT_AMBULATORY_CARE_PROVIDER_SITE_OTHER): Payer: Medicare Other | Admitting: Family Medicine

## 2017-10-20 ENCOUNTER — Encounter: Payer: Self-pay | Admitting: Family Medicine

## 2017-10-20 VITALS — BP 136/80 | HR 64 | Ht 64.75 in | Wt 151.2 lb

## 2017-10-20 DIAGNOSIS — B029 Zoster without complications: Secondary | ICD-10-CM | POA: Diagnosis not present

## 2017-10-20 MED ORDER — FAMCICLOVIR 500 MG PO TABS: 500.0000 mg | ORAL_TABLET | Freq: Three times a day (TID) | ORAL | 0 refills | Status: DC

## 2017-10-20 NOTE — Patient Instructions (Signed)
Shingles Shingles, which is also known as herpes zoster, is an infection that causes a painful skin rash and fluid-filled blisters. Shingles is not related to genital herpes, which is a sexually transmitted infection. Shingles only develops in people who:  Have had chickenpox.  Have received the chickenpox vaccine. (This is rare.)  What are the causes? Shingles is caused by varicella-zoster virus (VZV). This is the same virus that causes chickenpox. After exposure to VZV, the virus stays in the body in an inactive (dormant) state. Shingles develops if the virus reactivates. This can happen many years after the initial exposure to VZV. It is not known what causes this virus to reactivate. What increases the risk? People who have had chickenpox or received the chickenpox vaccine are at risk for shingles. Infection is more common in people who:  Are older than age 50.  Have a weakened defense (immune) system, such as those with HIV, AIDS, or cancer.  Are taking medicines that weaken the immune system, such as transplant medicines.  Are under great stress.  What are the signs or symptoms? Early symptoms of this condition include itching, tingling, and pain in an area on your skin. Pain may be described as burning, stabbing, or throbbing. A few days or weeks after symptoms start, a painful red rash appears, usually on one side of the body in a bandlike or beltlike pattern. The rash eventually turns into fluid-filled blisters that break open, scab over, and dry up in about 2-3 weeks. At any time during the infection, you may also develop:  A fever.  Chills.  A headache.  An upset stomach.  How is this diagnosed? This condition is diagnosed with a skin exam. Sometimes, skin or fluid samples are taken from the blisters before a diagnosis is made. These samples are examined under a microscope or sent to a lab for testing. How is this treated? There is no specific cure for this condition.  Your health care provider will probably prescribe medicines to help you manage pain, recover more quickly, and avoid long-term problems. Medicines may include:  Antiviral drugs.  Anti-inflammatory drugs.  Pain medicines.  If the area involved is on your face, you may be referred to a specialist, such as an eye doctor (ophthalmologist) or an ear, nose, and throat (ENT) doctor to help you avoid eye problems, chronic pain, or disability. Follow these instructions at home: Medicines  Take medicines only as directed by your health care provider.  Apply an anti-itch or numbing cream to the affected area as directed by your health care provider. Blister and Rash Care  Take a cool bath or apply cool compresses to the area of the rash or blisters as directed by your health care provider. This may help with pain and itching.  Keep your rash covered with a loose bandage (dressing). Wear loose-fitting clothing to help ease the pain of material rubbing against the rash.  Keep your rash and blisters clean with mild soap and cool water or as directed by your health care provider.  Check your rash every day for signs of infection. These include redness, swelling, and pain that lasts or increases.  Do not pick your blisters.  Do not scratch your rash. General instructions  Rest as directed by your health care provider.  Keep all follow-up visits as directed by your health care provider. This is important.  Until your blisters scab over, your infection can cause chickenpox in people who have never had it or been vaccinated   against it. To prevent this from happening, avoid contact with other people, especially: ? Babies. ? Pregnant women. ? Children who have eczema. ? Elderly people who have transplants. ? People who have chronic illnesses, such as leukemia or AIDS. Contact a health care provider if:  Your pain is not relieved with prescribed medicines.  Your pain does not get better after  the rash heals.  Your rash looks infected. Signs of infection include redness, swelling, and pain that lasts or increases. Get help right away if:  The rash is on your face or nose.  You have facial pain, pain around your eye area, or loss of feeling on one side of your face.  You have ear pain or you have ringing in your ear.  You have loss of taste.  Your condition gets worse. This information is not intended to replace advice given to you by your health care provider. Make sure you discuss any questions you have with your health care provider. Document Released: 04/15/2005 Document Revised: 12/10/2015 Document Reviewed: 02/24/2014 Elsevier Interactive Patient Education  2018 [employer]  

## 2017-10-20 NOTE — Progress Notes (Signed)
Subjective:  Patient ID: Jennifer Boone, female    DOB: April 01, 1934  Age: 84 y.o. MRN: [id number]  CC: Oral Pain   HPI Jennifer Boone presents for evaluation of a 2-day history of pain in the roof of her mouth.  There is been no injury to this area.  She has had no recent issues with her sinuses to include nasal congestion postnasal drip facial pressure teeth pain.  She has had some pain radiating out through the side of her left face.  She has no history of bruxism.  She does have an audible click on the right side when she opens her mouth.  There is been some stress in her life.  Husband was diagnosed with renal cancer and is status post nephrectomy 6 weeks ago.  Patient diagnosed with rheumatoid arthritis a year and a half ago and is taking Rheumatrex 5 mg weekly with 2 mg of folic acid daily.  Outpatient Medications Prior to Visit  Medication Sig Dispense Refill  . baclofen (LIORESAL) 10 MG tablet TAKE 1 TABLET(10 MG) BY MOUTH THREE TIMES DAILY AS NEEDED FOR MUSCLE SPASMS 270 tablet 0  . baclofen (LIORESAL) 10 MG tablet TAKE 1 TABLET(10 MG) BY MOUTH THREE TIMES DAILY AS NEEDED FOR MUSCLE SPASMS 30 tablet 0  . cholecalciferol (VITAMIN D) 400 units TABS tablet Take 400 Units by mouth.    . Coenzyme Q10 (COQ-10) 100 MG CAPS Take by mouth.    . FIBER COMPLETE PO Take by mouth.    . folic acid (FOLVITE) 1 MG tablet Take by mouth.    Marland Kitchen glucosamine-chondroitin 500-400 MG tablet Take 1 tablet by mouth 3 (three) times daily.      . methotrexate (RHEUMATREX) 2.5 MG tablet TK 4 TS PO 1 TIME WEEKLY  2  . Multiple Vitamins-Minerals (PRESERVISION AREDS 2 PO) Take by mouth.    . propranolol (INDERAL) 10 MG tablet Take 10 mg by mouth 3 (three) times daily.    . ranitidine (ZANTAC) 300 MG tablet TAKE 1 TABLET BY MOUTH EVERY NIGHT AT BEDTIME 90 tablet 0  . simvastatin (ZOCOR) 40 MG tablet Take 40 mg by mouth at bedtime.      . SUMAtriptan (IMITREX) 50 MG tablet Take 50 mg by mouth every 2 (two) hours as  needed for migraine. May repeat in 2 hours if headache persists or recurs.    Marland Kitchen omeprazole (PRILOSEC) 20 MG capsule Take 20 mg by mouth daily.      Marland Kitchen azithromycin (ZITHROMAX) 250 MG tablet Take PO 2 tabs on day 1 and 1 tab on days 2-5 (Patient not taking: Reported on 10/09/2017) 6 tablet 0   No facility-administered medications prior to visit.     ROS Review of Systems  Constitutional: Negative for chills, diaphoresis, fatigue, fever and unexpected weight change.  HENT: Negative for congestion, dental problem, ear pain, postnasal drip, rhinorrhea, sinus pressure, sinus pain, sneezing, sore throat, trouble swallowing and voice change.   Eyes: Negative for photophobia and visual disturbance.  Respiratory: Negative for choking and wheezing.   Cardiovascular: Negative.   Gastrointestinal: Negative.   Musculoskeletal: Positive for arthralgias and myalgias.  Skin: Negative for color change, pallor and rash.  Allergic/Immunologic: Negative for immunocompromised state.  Neurological: Negative for headaches.  Hematological: Does not bruise/bleed easily.  Psychiatric/Behavioral: Negative.     Objective:  BP 136/80   Pulse 64   Ht 5' 4.75" (1.645 m)   Wt 151 lb 4 oz (68.6 kg)   SpO2 96%  BMI 25.36 kg/m   BP Readings from Last 3 Encounters:  10/20/17 136/80  10/09/17 126/78  08/25/17 122/80    Wt Readings from Last 3 Encounters:  10/20/17 151 lb 4 oz (68.6 kg)  10/09/17 152 lb 6.4 oz (69.1 kg)  08/25/17 147 lb 12.8 oz (67 kg)    Physical Exam  Constitutional: She is oriented to person, place, and time. She appears well-developed and well-nourished. No distress.  HENT:  Head: Normocephalic and atraumatic.  Right Ear: External ear normal.  Left Ear: External ear normal.  Mouth/Throat: Oropharynx is clear and moist.    Eyes: Pupils are equal, round, and reactive to light. Conjunctivae and EOM are normal. Right eye exhibits no discharge. Left eye exhibits no discharge. No scleral  icterus.  Neck: Neck supple. No JVD present. No tracheal deviation present. No thyromegaly present.  Cardiovascular: Normal rate, regular rhythm and normal heart sounds.  Pulmonary/Chest: Effort normal and breath sounds normal.  Lymphadenopathy:    She has no cervical adenopathy.  Neurological: She is alert and oriented to person, place, and time.  Skin: Skin is warm and dry. She is not diaphoretic.  Psychiatric: She has a normal mood and affect. Her behavior is normal.    Lab Results  Component Value Date   WBC 6.2 08/13/2017   HGB 13.2 08/13/2017   HCT 39.2 08/13/2017   PLT 323.0 08/13/2017   GLUCOSE 99 08/14/2017   CHOL 118 08/13/2017   TRIG 59.0 08/13/2017   HDL 75.40 08/13/2017   LDLCALC 31 08/13/2017   ALT 21 08/13/2017   AST 16 08/13/2017   NA 135 08/14/2017   K 4.6 08/14/2017   CL 101 08/14/2017   CREATININE 0.67 08/14/2017   BUN 16 08/14/2017   CO2 28 08/14/2017   TSH 2.65 08/13/2017   HGBA1C 5.4 01/27/2007    Dg Chest 2 View  Result Date: 01/31/2017 CLINICAL DATA:  Cough for 1.5 weeks, history hypertension, GERD, former smoker EXAM: CHEST  2 VIEW COMPARISON:  CT chest 11/22/2016 FINDINGS: Normal heart size, mediastinal contours, and pulmonary vascularity. Atherosclerotic calcification aorta. Central peribronchial thickening and mild hyperinflation. No acute infiltrate, pleural effusion, or pneumothorax. Bones appear demineralized. IMPRESSION: Bronchitic changes and mild hyperinflation which could reflect asthma or COPD. No acute infiltrate. Electronically Signed   By: Lavonia Dana M.D.   On: 01/31/2017 19:52    Assessment & Plan:   Jennifer Boone was seen today for oral pain.  Diagnoses and all orders for this visit:  Herpes zoster without complication -     famciclovir (FAMVIR) 500 MG tablet; Take 1 tablet (500 mg total) by mouth 3 (three) times daily for 7 days.   I have discontinued Quin Tsan's omeprazole and azithromycin. I am also having her start on  famciclovir. Additionally, I am having her maintain her glucosamine-chondroitin, simvastatin, Multiple Vitamins-Minerals (PRESERVISION AREDS 2 PO), FIBER COMPLETE PO, propranolol, CoQ-10, SUMAtriptan, methotrexate, ranitidine, folic acid, cholecalciferol, baclofen, and baclofen.  Meds ordered this encounter  Medications  . famciclovir (FAMVIR) 500 MG tablet    Sig: Take 1 tablet (500 mg total) by mouth 3 (three) times daily for 7 days.    Dispense:  21 tablet    Refill:  0     Follow-up: Return in about 1 week (around 10/27/2017), or if symptoms worsen or fail to improve.  Libby Maw, MD

## 2017-10-21 ENCOUNTER — Ambulatory Visit: Payer: Medicare Other | Admitting: Family Medicine

## 2017-10-27 ENCOUNTER — Encounter: Payer: Self-pay | Admitting: Family Medicine

## 2017-10-27 ENCOUNTER — Ambulatory Visit (INDEPENDENT_AMBULATORY_CARE_PROVIDER_SITE_OTHER): Payer: Medicare Other | Admitting: Family Medicine

## 2017-10-27 VITALS — BP 126/80 | HR 64 | Temp 98.0°F | Ht 64.75 in | Wt 151.0 lb

## 2017-10-27 DIAGNOSIS — L03313 Cellulitis of chest wall: Secondary | ICD-10-CM

## 2017-10-27 DIAGNOSIS — B029 Zoster without complications: Secondary | ICD-10-CM | POA: Diagnosis not present

## 2017-10-27 MED ORDER — MUPIROCIN 2 % EX OINT: TOPICAL_OINTMENT | CUTANEOUS | 0 refills | Status: DC

## 2017-10-27 MED ORDER — FAMCICLOVIR 500 MG PO TABS: 500.0000 mg | ORAL_TABLET | Freq: Three times a day (TID) | ORAL | 0 refills | Status: AC

## 2017-10-27 MED ORDER — MAGIC MOUTHWASH: ORAL | 0 refills | Status: DC

## 2017-10-27 NOTE — Progress Notes (Signed)
Subjective:  Patient ID: Jennifer Boone, female    DOB: 04-Jul-1933  Age: 84 y.o. MRN: [id number]  CC: Follow-up   HPI Alnita Aybar presents for follow-up of her oral zoster.  She recently had a lesion excised on her chest and scratched the overlying scab off in her sleep.  The remaining wound has been slightly inflamed.  She describes a circle of paresthesias surrounding the wound.  The inside of her mouth is improved greatly after the Famvir but remains a little sore.  She did have the shingles vaccination many years ago.  She has not had the newer vaccine.  Outpatient Medications Prior to Visit  Medication Sig Dispense Refill  . cholecalciferol (VITAMIN D) 400 units TABS tablet Take 400 Units by mouth.    . Coenzyme Q10 (COQ-10) 100 MG CAPS Take by mouth.    . FIBER COMPLETE PO Take by mouth.    . folic acid (FOLVITE) 1 MG tablet Take 2 mg by mouth daily.    Marland Kitchen glucosamine-chondroitin 500-400 MG tablet Take 1 tablet by mouth 3 (three) times daily.      . methotrexate (RHEUMATREX) 2.5 MG tablet Take 5 tablets by mouth once weekly.    . Multiple Vitamins-Minerals (PRESERVISION AREDS 2 PO) Take by mouth.    . propranolol (INDERAL) 10 MG tablet Take 10 mg by mouth daily.     . ranitidine (ZANTAC) 300 MG tablet TAKE 1 TABLET BY MOUTH EVERY NIGHT AT BEDTIME 90 tablet 0  . simvastatin (ZOCOR) 40 MG tablet Take 40 mg by mouth at bedtime.      . SUMAtriptan (IMITREX) 50 MG tablet Take 50 mg by mouth every 2 (two) hours as needed for migraine. May repeat in 2 hours if headache persists or recurs.    . famciclovir (FAMVIR) 500 MG tablet Take 1 tablet (500 mg total) by mouth 3 (three) times daily for 7 days. 21 tablet 0   No facility-administered medications prior to visit.     ROS Review of Systems  Constitutional: Negative for diaphoresis, fever and unexpected weight change.  HENT: Positive for mouth sores. Negative for postnasal drip, rhinorrhea, sinus pressure, sinus pain, trouble  swallowing and voice change.   Eyes: Negative.   Respiratory: Negative.   Cardiovascular: Negative.   Gastrointestinal: Negative.   Musculoskeletal: Negative for arthralgias and myalgias.  Skin: Positive for color change and rash.  Neurological: Negative for weakness and headaches.  Hematological: Does not bruise/bleed easily.  Psychiatric/Behavioral: Negative.     Objective:  BP 126/80   Pulse 64   Temp 98 F (36.7 C)   Ht 5' 4.75" (1.645 m)   Wt 151 lb (68.5 kg)   SpO2 96%   BMI 25.32 kg/m   BP Readings from Last 3 Encounters:  10/27/17 126/80  10/20/17 136/80  10/09/17 126/78    Wt Readings from Last 3 Encounters:  10/27/17 151 lb (68.5 kg)  10/20/17 151 lb 4 oz (68.6 kg)  10/09/17 152 lb 6.4 oz (69.1 kg)    Physical Exam  Constitutional: She is oriented to person, place, and time. She appears well-developed and well-nourished. No distress.  HENT:  Head: Normocephalic and atraumatic.  Right Ear: External ear normal.  Left Ear: External ear normal.  Mouth/Throat:    Eyes: Conjunctivae are normal. Right eye exhibits no discharge. Left eye exhibits no discharge. No scleral icterus.  Neck: Normal range of motion. Neck supple. No JVD present. No tracheal deviation present. No thyromegaly present.  Pulmonary/Chest: Effort normal.  Lymphadenopathy:    She has no cervical adenopathy.  Neurological: She is alert and oriented to person, place, and time.  Skin: Skin is warm and dry. She is not diaphoretic.     Psychiatric: She has a normal mood and affect. Her behavior is normal.    Lab Results  Component Value Date   WBC 6.2 08/13/2017   HGB 13.2 08/13/2017   HCT 39.2 08/13/2017   PLT 323.0 08/13/2017   GLUCOSE 99 08/14/2017   CHOL 118 08/13/2017   TRIG 59.0 08/13/2017   HDL 75.40 08/13/2017   LDLCALC 31 08/13/2017   ALT 21 08/13/2017   AST 16 08/13/2017   NA 135 08/14/2017   K 4.6 08/14/2017   CL 101 08/14/2017   CREATININE 0.67 08/14/2017   BUN 16  08/14/2017   CO2 28 08/14/2017   TSH 2.65 08/13/2017   HGBA1C 5.4 01/27/2007    Dg Chest 2 View  Result Date: 01/31/2017 CLINICAL DATA:  Cough for 1.5 weeks, history hypertension, GERD, former smoker EXAM: CHEST  2 VIEW COMPARISON:  CT chest 11/22/2016 FINDINGS: Normal heart size, mediastinal contours, and pulmonary vascularity. Atherosclerotic calcification aorta. Central peribronchial thickening and mild hyperinflation. No acute infiltrate, pleural effusion, or pneumothorax. Bones appear demineralized. IMPRESSION: Bronchitic changes and mild hyperinflation which could reflect asthma or COPD. No acute infiltrate. Electronically Signed   By: Lavonia Dana M.D.   On: 01/31/2017 19:52    Assessment & Plan:   Denese was seen today for follow-up.  Diagnoses and all orders for this visit:  Herpes zoster without complication -     magic mouthwash SOLN; Take 55ml to swish for one minute and expectorate four times daily for 10 days. -     famciclovir (FAMVIR) 500 MG tablet; Take 1 tablet (500 mg total) by mouth 3 (three) times daily for 5 days.  Cellulitis of chest wall -     mupirocin ointment (BACTROBAN) 2 %; Apply to excision site on chest three times a day for 7 days.   I have discontinued Honore Gruhn's famciclovir. I am also having her start on mupirocin ointment, magic mouthwash, and famciclovir. Additionally, I am having her maintain her glucosamine-chondroitin, simvastatin, Multiple Vitamins-Minerals (PRESERVISION AREDS 2 PO), FIBER COMPLETE PO, propranolol, CoQ-10, SUMAtriptan, ranitidine, cholecalciferol, folic acid, and methotrexate.  Meds ordered this encounter  Medications  . mupirocin ointment (BACTROBAN) 2 %    Sig: Apply to excision site on chest three times a day for 7 days.    Dispense:  15 g    Refill:  0  . magic mouthwash SOLN    Sig: Take 23ml to swish for one minute and expectorate four times daily for 10 days.    Dispense:  200 mL    Refill:  0  . famciclovir  (FAMVIR) 500 MG tablet    Sig: Take 1 tablet (500 mg total) by mouth 3 (three) times daily for 5 days.    Dispense:  15 tablet    Refill:  0     Follow-up: Return if symptoms worsen or fail to improve.  Libby Maw, MD

## 2017-10-29 ENCOUNTER — Other Ambulatory Visit: Payer: Self-pay | Admitting: Family Medicine

## 2017-10-29 MED ORDER — SUMATRIPTAN SUCCINATE 50 MG PO TABS: 50.0000 mg | ORAL_TABLET | ORAL | 2 refills | Status: AC | PRN

## 2017-11-07 DIAGNOSIS — H04123 Dry eye syndrome of bilateral lacrimal glands: Secondary | ICD-10-CM | POA: Diagnosis not present

## 2017-11-07 DIAGNOSIS — H1032 Unspecified acute conjunctivitis, left eye: Secondary | ICD-10-CM | POA: Diagnosis not present

## 2017-12-02 DIAGNOSIS — M2550 Pain in unspecified joint: Secondary | ICD-10-CM | POA: Diagnosis not present

## 2017-12-02 DIAGNOSIS — E663 Overweight: Secondary | ICD-10-CM | POA: Diagnosis not present

## 2017-12-02 DIAGNOSIS — Z6825 Body mass index (BMI) 25.0-25.9, adult: Secondary | ICD-10-CM | POA: Diagnosis not present

## 2017-12-02 DIAGNOSIS — M06041 Rheumatoid arthritis without rheumatoid factor, right hand: Secondary | ICD-10-CM | POA: Diagnosis not present

## 2017-12-02 DIAGNOSIS — M06042 Rheumatoid arthritis without rheumatoid factor, left hand: Secondary | ICD-10-CM | POA: Diagnosis not present

## 2017-12-02 DIAGNOSIS — H04123 Dry eye syndrome of bilateral lacrimal glands: Secondary | ICD-10-CM | POA: Diagnosis not present

## 2017-12-03 DIAGNOSIS — B353 Tinea pedis: Secondary | ICD-10-CM | POA: Diagnosis not present

## 2017-12-03 DIAGNOSIS — L820 Inflamed seborrheic keratosis: Secondary | ICD-10-CM | POA: Diagnosis not present

## 2017-12-03 DIAGNOSIS — L570 Actinic keratosis: Secondary | ICD-10-CM | POA: Diagnosis not present

## 2017-12-23 ENCOUNTER — Ambulatory Visit (INDEPENDENT_AMBULATORY_CARE_PROVIDER_SITE_OTHER): Payer: Medicare Other

## 2017-12-23 ENCOUNTER — Ambulatory Visit (INDEPENDENT_AMBULATORY_CARE_PROVIDER_SITE_OTHER): Payer: Medicare Other | Admitting: Family Medicine

## 2017-12-23 ENCOUNTER — Encounter: Payer: Self-pay | Admitting: Family Medicine

## 2017-12-23 VITALS — BP 120/80 | HR 61 | Temp 98.1°F | Ht 64.75 in | Wt 158.0 lb

## 2017-12-23 DIAGNOSIS — M25421 Effusion, right elbow: Secondary | ICD-10-CM | POA: Diagnosis not present

## 2017-12-23 DIAGNOSIS — M25521 Pain in right elbow: Secondary | ICD-10-CM

## 2017-12-23 DIAGNOSIS — M542 Cervicalgia: Secondary | ICD-10-CM

## 2017-12-23 MED ORDER — METHYLPREDNISOLONE 4 MG PO TBPK: ORAL_TABLET | ORAL | 0 refills | Status: DC

## 2017-12-23 MED ORDER — PROPRANOLOL HCL 10 MG PO TABS: 10.0000 mg | ORAL_TABLET | Freq: Every day | ORAL | 2 refills | Status: DC

## 2017-12-23 NOTE — Patient Instructions (Signed)
Start steroid as directed We'll call with xray results.

## 2017-12-23 NOTE — Progress Notes (Signed)
Jennifer Boone - 84 y.o. female MRN [id number]  Date of birth: 04/25/1934  Subjective Chief Complaint  Patient presents with  . Elbow Pain    left elbow has been hurting since Sunday, no ROM, pain. Did not hit or bump.  . Neck Pain    2 weeks of constant neck pain    HPI Jennifer Boone is a 84 y.o. female with history of RA, HTN, HLD and GERD here today with complaint of R elbow pain and neck pain.    -Neck pain:  Neck pain began about 2 weeks ago.  Located across upper neck bilaterally.  She denies radiation of pain, numbness, tingling or weakness.  She has not had associated headaches with this.  ROM of head and neck has been normal.  She denies fever/chills.  She has not tried anything for treatment so far.    -Elbow pain:  Pain in R elbow for 2 days.  Denies trauma or known overuse.  Has difficulty with full flexion of the arm with some pain on extension as well.  She denies swelling of the joint.  Methotrexate was recently increased as well.  No numbness/tingling/weakness.   ROS:  A comprehensive ROS was completed and negative except as noted per HPI  Allergies  Allergen Reactions  . Celebrex [Celecoxib]     Hives, throat closes  . Tape     Hives, cannot tolerate surgical tape.  Hives, cannot tolerate surgical tape.   . Amoxicillin     REACTION: hives  . Cefuroxime Axetil Hives  . Clindamycin     REACTION: hives  . Codeine     Cannot remember, bad reaction  . Erythromycin Base     Intestinal distress  . Grass Extracts [Gramineae Pollens]     Lab tests positive  . Other     Lab positive Surgical tape -takes her skin off  . Tetracycline     REACTION: GI upset  . Tree Extract     Lab positive    Past Medical History:  Diagnosis Date  . Allergy   . Arrhythmia   . Arthritis   . GERD (gastroesophageal reflux disease)   . Hyperlipidemia   . Hypertension   . IBS (irritable bowel syndrome)   . Rheumatic fever    Childhood  . 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 ABDOMINAL HYSTERECTOMY    . APPENDECTOMY    . BOWEL RESECTION    . CATARACT EXTRACTION    . CHOLECYSTECTOMY    . HEMORROIDECTOMY    . ROTATOR CUFF REPAIR    . TONSILLECTOMY      Social History   Socioeconomic History  . Marital status: Married    Spouse name: Not on file  . Number of children: Not on file  . Years of education: Not on file  . Highest education level: Not on file  Occupational History  . Not on file  Social Needs  . Financial resource strain: Not on file  . Food insecurity:    Worry: Not on file    Inability: Not on file  . Transportation needs:    Medical: Not on file    Non-medical: Not on file  Tobacco Use  . Smoking status: Former Smoker    Types: Cigarettes    Last attempt to quit: 04/29/1976    Years since quitting: 41.6  . Smokeless tobacco: Never Used  Substance and Sexual  Activity  . Alcohol use: No  . Drug use: No  . Sexual activity: Never  Lifestyle  . Physical activity:    Days per week: Not on file    Minutes per session: Not on file  . Stress: Not on file  Relationships  . Social connections:    Talks on phone: Not on file    Gets together: Not on file    Attends religious service: Not on file    Active member of club or organization: Not on file    Attends meetings of clubs or organizations: Not on file    Relationship status: Not on file  Other Topics Concern  . Not on file  Social History Narrative  . Not on file    Family History  Problem Relation Age of Onset  . Heart disease Mother   . Diabetes Sister   . Heart disease Sister     Health Maintenance  Topic Date Due  . PNA vac Low Risk Adult (2 of 2 - PCV13) 05/09/2009  . INFLUENZA VACCINE  11/27/2017  . TETANUS/TDAP  04/08/2026  . DEXA SCAN  Completed     ----------------------------------------------------------------------------------------------------------------------------------------------------------------------------------------------------------------- Physical Exam BP 120/80 (BP Location: Left Arm, Patient Position: Sitting, Cuff Size: Normal)   Pulse 61   Temp 98.1 F (36.7 C) (Oral)   Ht 5' 4.75" (1.645 m)   Wt 158 lb (71.7 kg)   SpO2 96%   BMI 26.50 kg/m   Physical Exam  Constitutional: She is oriented to person, place, and time. She appears well-nourished. No distress.  HENT:  Head: Normocephalic and atraumatic.  Neck: Normal range of motion. Neck supple.  Mild ttp along upper cervical paraspinals.   Musculoskeletal:  R elbow with ttp over olecranon.  No effusion or swelling noted.  Limited ROM in flexion with painful extension.    Neurological: She is alert and oriented to person, place, and time. No cranial nerve deficit.  Skin: Skin is warm and dry.  Psychiatric: She has a normal mood and affect. Her behavior is normal.    ------------------------------------------------------------------------------------------------------------------------------------------------------------------------------------------------------------------- Assessment and Plan  Neck pain Neck pain likely related to OA/RA.  No red flags in history or on exam today I think steroid may help some with this, if not improving she will follow up with me.    Right elbow pain Appears to be related to OA/RA. Xray ordered.  Allergy to celeebrex Rx for medrol doespak F/u in not improving.

## 2017-12-23 NOTE — Assessment & Plan Note (Signed)
Neck pain likely related to OA/RA.  No red flags in history or on exam today I think steroid may help some with this, if not improving she will follow up with me.

## 2017-12-23 NOTE — Assessment & Plan Note (Signed)
Appears to be related to OA/RA. Xray ordered.  Allergy to celeebrex Rx for medrol doespak F/u in not improving.

## 2017-12-24 NOTE — Progress Notes (Signed)
Xray shows some swelling of the joint.  This is likely related to her arthritis.  Hopefully she should have some improvement with steroid

## 2018-01-06 DIAGNOSIS — M06041 Rheumatoid arthritis without rheumatoid factor, right hand: Secondary | ICD-10-CM | POA: Diagnosis not present

## 2018-01-15 ENCOUNTER — Encounter: Payer: Self-pay | Admitting: Family Medicine

## 2018-01-15 ENCOUNTER — Ambulatory Visit (INDEPENDENT_AMBULATORY_CARE_PROVIDER_SITE_OTHER): Payer: Medicare Other | Admitting: Family Medicine

## 2018-01-15 VITALS — BP 110/80 | HR 61 | Temp 98.0°F | Wt 154.6 lb

## 2018-01-15 DIAGNOSIS — I8390 Asymptomatic varicose veins of unspecified lower extremity: Secondary | ICD-10-CM

## 2018-01-15 DIAGNOSIS — E782 Mixed hyperlipidemia: Secondary | ICD-10-CM

## 2018-01-15 DIAGNOSIS — I10 Essential (primary) hypertension: Secondary | ICD-10-CM | POA: Diagnosis not present

## 2018-01-15 DIAGNOSIS — E785 Hyperlipidemia, unspecified: Secondary | ICD-10-CM

## 2018-01-15 LAB — COMPREHENSIVE METABOLIC PANEL
ALT: 23 U/L (ref 0–35)
AST: 20 U/L (ref 0–37)
Albumin: 4.2 g/dL (ref 3.5–5.2)
Alkaline Phosphatase: 67 U/L (ref 39–117)
BILIRUBIN TOTAL: 0.5 mg/dL (ref 0.2–1.2)
BUN: 15 mg/dL (ref 6–23)
CO2: 29 mEq/L (ref 19–32)
Calcium: 9.7 mg/dL (ref 8.4–10.5)
Chloride: 102 mEq/L (ref 96–112)
Creatinine, Ser: 0.75 mg/dL (ref 0.40–1.20)
GFR: 78.62 mL/min (ref >60.00)
GLUCOSE: 107 mg/dL — AB (ref 70–99)
Potassium: 4.8 mEq/L (ref 3.5–5.1)
SODIUM: 136 meq/L (ref 135–145)
Total Protein: 6.7 g/dL (ref 6.0–8.3)

## 2018-01-15 LAB — LIPID PANEL
CHOL/HDL RATIO: 2
Cholesterol: 101 mg/dL (ref 0–200)
HDL: 64.1 mg/dL (ref >39.00)
LDL Cholesterol: 23 mg/dL (ref 0–99)
NONHDL: 37.35
Triglycerides: 72 mg/dL (ref 0.0–149.0)
VLDL: 14.4 mg/dL (ref 0.0–40.0)

## 2018-01-15 LAB — CBC
HCT: 38.7 % (ref 36.0–46.0)
HEMOGLOBIN: 13.2 g/dL (ref 12.0–15.0)
MCHC: 34.2 g/dL (ref 30.0–36.0)
MCV: 88.9 fl (ref 78.0–100.0)
PLATELETS: 294 10*3/uL (ref 150.0–400.0)
RBC: 4.35 Mil/uL (ref 3.87–5.11)
RDW: 14.6 % (ref 11.5–15.5)
WBC: 6 10*3/uL (ref 4.0–10.5)

## 2018-01-15 NOTE — Progress Notes (Signed)
Jennifer Boone - 84 y.o. female MRN [id number]  Date of birth: 1933-08-14  Subjective Chief Complaint  Patient presents with  . Hypertension    HPI Jennifer Boone is a 84 y.o. female with history of HTN, RA and HLD here today for follow up.    -HTN:  She is compliant with propranolol for hypertension.  Denies side effects.  She has noticed mild swelling in her lower extremities over the past few weeks if sitting or standing too long.  Denies shortness of breath, chest tightness, exertional dyspnea.    -HLD:  Doing well with simvastatin.  Wishes to have lipids rechecked before heading back to [place].  Denies myalgias.  ROS:  A comprehensive ROS was completed and negative except as noted per HPI  Allergies  Allergen Reactions  . Celebrex [Celecoxib]     Hives, throat closes  . Tape     Hives, cannot tolerate surgical tape.  Hives, cannot tolerate surgical tape.   . Amoxicillin     REACTION: hives  . Cefuroxime Axetil Hives  . Clindamycin     REACTION: hives  . Codeine     Cannot remember, bad reaction  . Erythromycin Base     Intestinal distress  . Grass Extracts [Gramineae Pollens]     Lab tests positive  . Other     Lab positive Surgical tape -takes her skin off  . Tetracycline     REACTION: GI upset  . Tree Extract     Lab positive    Past Medical History:  Diagnosis Date  . Allergy   . Arrhythmia   . Arthritis   . GERD (gastroesophageal reflux disease)   . Hyperlipidemia   . Hypertension   . IBS (irritable bowel syndrome)   . Rheumatic fever    Childhood  . 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 ABDOMINAL HYSTERECTOMY    . APPENDECTOMY    . BOWEL RESECTION    . CATARACT EXTRACTION    . CHOLECYSTECTOMY    . HEMORROIDECTOMY    . ROTATOR CUFF REPAIR    . TONSILLECTOMY      Social History   Socioeconomic History  . Marital status: Married    Spouse name: Not on file  . Number of children: Not  on file  . Years of education: Not on file  . Highest education level: Not on file  Occupational History  . Not on file  Social Needs  . Financial resource strain: Not on file  . Food insecurity:    Worry: Not on file    Inability: Not on file  . Transportation needs:    Medical: Not on file    Non-medical: Not on file  Tobacco Use  . Smoking status: Former Smoker    Types: Cigarettes    Last attempt to quit: 04/29/1976    Years since quitting: 41.7  . Smokeless tobacco: Never Used  Substance and Sexual Activity  . Alcohol use: No  . Drug use: No  . Sexual activity: Never  Lifestyle  . Physical activity:    Days per week: Not on file    Minutes per session: Not on file  . Stress: Not on file  Relationships  . Social connections:    Talks on phone: Not on file    Gets together: Not on file    Attends religious service: Not on file    Active member of  club or organization: Not on file    Attends meetings of clubs or organizations: Not on file    Relationship status: Not on file  Other Topics Concern  . Not on file  Social History Narrative  . Not on file    Family History  Problem Relation Age of Onset  . Heart disease Mother   . Diabetes Sister   . Heart disease Sister     Health Maintenance  Topic Date Due  . PNA vac Low Risk Adult (2 of 2 - PCV13) 05/09/2009  . INFLUENZA VACCINE  11/27/2017  . TETANUS/TDAP  04/08/2026  . DEXA SCAN  Completed    ----------------------------------------------------------------------------------------------------------------------------------------------------------------------------------------------------------------- Physical Exam BP 110/80 (BP Location: Left Arm, Patient Position: Sitting, Cuff Size: Normal)   Pulse 61   Temp 98 F (36.7 C) (Oral)   Wt 154 lb 9.6 oz (70.1 kg)   SpO2 96%   BMI 25.93 kg/m   Physical Exam  Constitutional: She is oriented to person, place, and time. She appears well-nourished. No  distress.  HENT:  Head: Normocephalic and atraumatic.  Mouth/Throat: Oropharynx is clear and moist.  Eyes: No scleral icterus.  Neck: Neck supple. No thyromegaly present.  Cardiovascular: Normal rate, regular rhythm and normal heart sounds.  Pulmonary/Chest: Effort normal and breath sounds normal.  Musculoskeletal: She exhibits edema (trace).  Neurological: She is alert and oriented to person, place, and time.  Skin: Skin is warm and dry.  Psychiatric: She has a normal mood and affect. Her behavior is normal.    ------------------------------------------------------------------------------------------------------------------------------------------------------------------------------------------------------------------- Assessment and Plan  Essential hypertension BP is well controlled, continue current medications   VARICOSE VEINS, LOWER EXTREMITIES Varicosities with venous insufficiency, recommend leg elevation at rest.  Can consider compression stockings as well if bothersome.   Mixed hyperlipidemia Continue simvastatin, update lipids.

## 2018-01-15 NOTE — Assessment & Plan Note (Signed)
-  BP is well controlled, continue current medications.  

## 2018-01-15 NOTE — Assessment & Plan Note (Signed)
Continue simvastatin, update lipids.

## 2018-01-15 NOTE — Patient Instructions (Signed)
We'll be in touch with lab results.  

## 2018-01-15 NOTE — Assessment & Plan Note (Signed)
Varicosities with venous insufficiency, recommend leg elevation at rest.  Can consider compression stockings as well if bothersome.

## 2018-01-16 DIAGNOSIS — Z23 Encounter for immunization: Secondary | ICD-10-CM | POA: Diagnosis not present

## 2018-01-22 DIAGNOSIS — L570 Actinic keratosis: Secondary | ICD-10-CM | POA: Diagnosis not present

## 2018-01-22 DIAGNOSIS — L820 Inflamed seborrheic keratosis: Secondary | ICD-10-CM | POA: Diagnosis not present

## 2018-01-22 DIAGNOSIS — B353 Tinea pedis: Secondary | ICD-10-CM | POA: Diagnosis not present

## 2018-01-22 DIAGNOSIS — L309 Dermatitis, unspecified: Secondary | ICD-10-CM | POA: Diagnosis not present

## 2018-01-26 DIAGNOSIS — H04123 Dry eye syndrome of bilateral lacrimal glands: Secondary | ICD-10-CM | POA: Diagnosis not present

## 2018-01-26 DIAGNOSIS — M06041 Rheumatoid arthritis without rheumatoid factor, right hand: Secondary | ICD-10-CM | POA: Diagnosis not present

## 2018-01-26 DIAGNOSIS — M19032 Primary osteoarthritis, left wrist: Secondary | ICD-10-CM | POA: Diagnosis not present

## 2018-01-26 DIAGNOSIS — R5383 Other fatigue: Secondary | ICD-10-CM | POA: Diagnosis not present

## 2018-01-26 DIAGNOSIS — M25552 Pain in left hip: Secondary | ICD-10-CM | POA: Diagnosis not present

## 2018-01-26 DIAGNOSIS — M06042 Rheumatoid arthritis without rheumatoid factor, left hand: Secondary | ICD-10-CM | POA: Diagnosis not present

## 2018-01-26 DIAGNOSIS — M2550 Pain in unspecified joint: Secondary | ICD-10-CM | POA: Diagnosis not present

## 2018-01-26 DIAGNOSIS — M19041 Primary osteoarthritis, right hand: Secondary | ICD-10-CM | POA: Diagnosis not present

## 2018-01-26 DIAGNOSIS — M19031 Primary osteoarthritis, right wrist: Secondary | ICD-10-CM | POA: Diagnosis not present

## 2018-01-26 DIAGNOSIS — E663 Overweight: Secondary | ICD-10-CM | POA: Diagnosis not present

## 2018-01-26 DIAGNOSIS — Z79899 Other long term (current) drug therapy: Secondary | ICD-10-CM | POA: Diagnosis not present

## 2018-01-26 DIAGNOSIS — Z6825 Body mass index (BMI) 25.0-25.9, adult: Secondary | ICD-10-CM | POA: Diagnosis not present

## 2018-01-26 DIAGNOSIS — M150 Primary generalized (osteo)arthritis: Secondary | ICD-10-CM | POA: Diagnosis not present

## 2018-02-19 DIAGNOSIS — R208 Other disturbances of skin sensation: Secondary | ICD-10-CM | POA: Diagnosis not present

## 2018-02-19 DIAGNOSIS — L820 Inflamed seborrheic keratosis: Secondary | ICD-10-CM | POA: Diagnosis not present

## 2018-02-19 DIAGNOSIS — L919 Hypertrophic disorder of the skin, unspecified: Secondary | ICD-10-CM | POA: Diagnosis not present

## 2018-02-19 DIAGNOSIS — L812 Freckles: Secondary | ICD-10-CM | POA: Diagnosis not present

## 2018-02-19 DIAGNOSIS — L821 Other seborrheic keratosis: Secondary | ICD-10-CM | POA: Diagnosis not present

## 2018-03-04 DIAGNOSIS — H35373 Puckering of macula, bilateral: Secondary | ICD-10-CM | POA: Diagnosis not present

## 2018-03-04 DIAGNOSIS — Z961 Presence of intraocular lens: Secondary | ICD-10-CM | POA: Diagnosis not present

## 2018-03-04 DIAGNOSIS — H04123 Dry eye syndrome of bilateral lacrimal glands: Secondary | ICD-10-CM | POA: Diagnosis not present

## 2018-03-04 DIAGNOSIS — H35363 Drusen (degenerative) of macula, bilateral: Secondary | ICD-10-CM | POA: Diagnosis not present

## 2018-03-11 DIAGNOSIS — K589 Irritable bowel syndrome without diarrhea: Secondary | ICD-10-CM | POA: Diagnosis not present

## 2018-03-11 DIAGNOSIS — M25559 Pain in unspecified hip: Secondary | ICD-10-CM | POA: Diagnosis not present

## 2018-03-11 DIAGNOSIS — Z6825 Body mass index (BMI) 25.0-25.9, adult: Secondary | ICD-10-CM | POA: Diagnosis not present

## 2018-03-11 DIAGNOSIS — I10 Essential (primary) hypertension: Secondary | ICD-10-CM | POA: Diagnosis not present

## 2018-03-23 DIAGNOSIS — M1990 Unspecified osteoarthritis, unspecified site: Secondary | ICD-10-CM | POA: Diagnosis not present

## 2018-03-23 DIAGNOSIS — M810 Age-related osteoporosis without current pathological fracture: Secondary | ICD-10-CM | POA: Diagnosis not present

## 2018-03-23 DIAGNOSIS — I10 Essential (primary) hypertension: Secondary | ICD-10-CM | POA: Diagnosis not present

## 2018-03-23 DIAGNOSIS — M19042 Primary osteoarthritis, left hand: Secondary | ICD-10-CM | POA: Diagnosis not present

## 2018-03-23 DIAGNOSIS — M19041 Primary osteoarthritis, right hand: Secondary | ICD-10-CM | POA: Diagnosis not present

## 2018-03-25 DIAGNOSIS — E041 Nontoxic single thyroid nodule: Secondary | ICD-10-CM | POA: Diagnosis not present

## 2018-03-25 DIAGNOSIS — A499 Bacterial infection, unspecified: Secondary | ICD-10-CM | POA: Diagnosis not present

## 2018-03-25 DIAGNOSIS — M810 Age-related osteoporosis without current pathological fracture: Secondary | ICD-10-CM | POA: Diagnosis not present

## 2018-03-25 DIAGNOSIS — I10 Essential (primary) hypertension: Secondary | ICD-10-CM | POA: Diagnosis not present

## 2018-03-25 DIAGNOSIS — E7800 Pure hypercholesterolemia, unspecified: Secondary | ICD-10-CM | POA: Diagnosis not present

## 2018-03-25 DIAGNOSIS — R7301 Impaired fasting glucose: Secondary | ICD-10-CM | POA: Diagnosis not present

## 2018-03-25 DIAGNOSIS — R82998 Other abnormal findings in urine: Secondary | ICD-10-CM | POA: Diagnosis not present

## 2018-03-25 DIAGNOSIS — Z79899 Other long term (current) drug therapy: Secondary | ICD-10-CM | POA: Diagnosis not present

## 2018-04-03 DIAGNOSIS — M1990 Unspecified osteoarthritis, unspecified site: Secondary | ICD-10-CM | POA: Diagnosis not present

## 2018-04-03 DIAGNOSIS — Z6825 Body mass index (BMI) 25.0-25.9, adult: Secondary | ICD-10-CM | POA: Diagnosis not present

## 2018-04-03 DIAGNOSIS — M069 Rheumatoid arthritis, unspecified: Secondary | ICD-10-CM | POA: Diagnosis not present

## 2018-04-03 DIAGNOSIS — M810 Age-related osteoporosis without current pathological fracture: Secondary | ICD-10-CM | POA: Diagnosis not present

## 2018-04-03 DIAGNOSIS — E7800 Pure hypercholesterolemia, unspecified: Secondary | ICD-10-CM | POA: Diagnosis not present

## 2018-04-03 DIAGNOSIS — K589 Irritable bowel syndrome without diarrhea: Secondary | ICD-10-CM | POA: Diagnosis not present

## 2018-04-03 DIAGNOSIS — I10 Essential (primary) hypertension: Secondary | ICD-10-CM | POA: Diagnosis not present

## 2018-04-16 DIAGNOSIS — M25552 Pain in left hip: Secondary | ICD-10-CM | POA: Diagnosis not present

## 2018-04-16 DIAGNOSIS — M19041 Primary osteoarthritis, right hand: Secondary | ICD-10-CM | POA: Diagnosis not present

## 2018-04-16 DIAGNOSIS — E663 Overweight: Secondary | ICD-10-CM | POA: Diagnosis not present

## 2018-04-16 DIAGNOSIS — M06041 Rheumatoid arthritis without rheumatoid factor, right hand: Secondary | ICD-10-CM | POA: Diagnosis not present

## 2018-04-16 DIAGNOSIS — M150 Primary generalized (osteo)arthritis: Secondary | ICD-10-CM | POA: Diagnosis not present

## 2018-04-16 DIAGNOSIS — M810 Age-related osteoporosis without current pathological fracture: Secondary | ICD-10-CM | POA: Diagnosis not present

## 2018-04-16 DIAGNOSIS — H04123 Dry eye syndrome of bilateral lacrimal glands: Secondary | ICD-10-CM | POA: Diagnosis not present

## 2018-04-16 DIAGNOSIS — M19042 Primary osteoarthritis, left hand: Secondary | ICD-10-CM | POA: Diagnosis not present

## 2018-04-16 DIAGNOSIS — Z6825 Body mass index (BMI) 25.0-25.9, adult: Secondary | ICD-10-CM | POA: Diagnosis not present

## 2018-04-16 DIAGNOSIS — M1990 Unspecified osteoarthritis, unspecified site: Secondary | ICD-10-CM | POA: Diagnosis not present

## 2018-04-16 DIAGNOSIS — M069 Rheumatoid arthritis, unspecified: Secondary | ICD-10-CM | POA: Diagnosis not present

## 2018-04-16 DIAGNOSIS — M2550 Pain in unspecified joint: Secondary | ICD-10-CM | POA: Diagnosis not present

## 2018-04-16 DIAGNOSIS — I10 Essential (primary) hypertension: Secondary | ICD-10-CM | POA: Diagnosis not present

## 2018-04-16 DIAGNOSIS — R5383 Other fatigue: Secondary | ICD-10-CM | POA: Diagnosis not present

## 2018-04-16 DIAGNOSIS — Z79899 Other long term (current) drug therapy: Secondary | ICD-10-CM | POA: Diagnosis not present

## 2018-04-16 DIAGNOSIS — M06042 Rheumatoid arthritis without rheumatoid factor, left hand: Secondary | ICD-10-CM | POA: Diagnosis not present

## 2018-04-28 DIAGNOSIS — M19031 Primary osteoarthritis, right wrist: Secondary | ICD-10-CM | POA: Diagnosis not present

## 2018-04-28 DIAGNOSIS — M19041 Primary osteoarthritis, right hand: Secondary | ICD-10-CM | POA: Diagnosis not present

## 2018-04-28 DIAGNOSIS — M19032 Primary osteoarthritis, left wrist: Secondary | ICD-10-CM | POA: Diagnosis not present

## 2018-05-09 DIAGNOSIS — M542 Cervicalgia: Secondary | ICD-10-CM | POA: Diagnosis not present

## 2018-05-09 DIAGNOSIS — E785 Hyperlipidemia, unspecified: Secondary | ICD-10-CM | POA: Diagnosis not present

## 2018-05-09 DIAGNOSIS — M62838 Other muscle spasm: Secondary | ICD-10-CM | POA: Diagnosis not present

## 2018-05-09 DIAGNOSIS — I10 Essential (primary) hypertension: Secondary | ICD-10-CM | POA: Diagnosis not present

## 2018-05-09 DIAGNOSIS — M436 Torticollis: Secondary | ICD-10-CM | POA: Diagnosis not present

## 2018-05-09 DIAGNOSIS — M059 Rheumatoid arthritis with rheumatoid factor, unspecified: Secondary | ICD-10-CM | POA: Diagnosis not present

## 2018-05-09 DIAGNOSIS — Z6823 Body mass index (BMI) 23.0-23.9, adult: Secondary | ICD-10-CM | POA: Diagnosis not present

## 2018-05-09 DIAGNOSIS — G43909 Migraine, unspecified, not intractable, without status migrainosus: Secondary | ICD-10-CM | POA: Diagnosis not present

## 2018-05-15 DIAGNOSIS — M542 Cervicalgia: Secondary | ICD-10-CM | POA: Diagnosis not present

## 2018-05-15 DIAGNOSIS — E785 Hyperlipidemia, unspecified: Secondary | ICD-10-CM | POA: Diagnosis not present

## 2018-05-15 DIAGNOSIS — Z6823 Body mass index (BMI) 23.0-23.9, adult: Secondary | ICD-10-CM | POA: Diagnosis not present

## 2018-05-15 DIAGNOSIS — M059 Rheumatoid arthritis with rheumatoid factor, unspecified: Secondary | ICD-10-CM | POA: Diagnosis not present

## 2018-05-15 DIAGNOSIS — G43909 Migraine, unspecified, not intractable, without status migrainosus: Secondary | ICD-10-CM | POA: Diagnosis not present

## 2018-05-15 DIAGNOSIS — M62838 Other muscle spasm: Secondary | ICD-10-CM | POA: Diagnosis not present

## 2018-05-15 DIAGNOSIS — I10 Essential (primary) hypertension: Secondary | ICD-10-CM | POA: Diagnosis not present

## 2018-05-21 DIAGNOSIS — M19042 Primary osteoarthritis, left hand: Secondary | ICD-10-CM | POA: Diagnosis not present

## 2018-05-21 DIAGNOSIS — M069 Rheumatoid arthritis, unspecified: Secondary | ICD-10-CM | POA: Diagnosis not present

## 2018-05-21 DIAGNOSIS — H903 Sensorineural hearing loss, bilateral: Secondary | ICD-10-CM | POA: Diagnosis not present

## 2018-05-21 DIAGNOSIS — M810 Age-related osteoporosis without current pathological fracture: Secondary | ICD-10-CM | POA: Diagnosis not present

## 2018-05-21 DIAGNOSIS — I10 Essential (primary) hypertension: Secondary | ICD-10-CM | POA: Diagnosis not present

## 2018-05-21 DIAGNOSIS — M19041 Primary osteoarthritis, right hand: Secondary | ICD-10-CM | POA: Diagnosis not present

## 2018-05-21 DIAGNOSIS — M1990 Unspecified osteoarthritis, unspecified site: Secondary | ICD-10-CM | POA: Diagnosis not present

## 2018-06-04 DIAGNOSIS — G4700 Insomnia, unspecified: Secondary | ICD-10-CM | POA: Diagnosis not present

## 2018-06-04 DIAGNOSIS — R51 Headache: Secondary | ICD-10-CM | POA: Diagnosis not present

## 2018-06-11 ENCOUNTER — Other Ambulatory Visit: Payer: Self-pay | Admitting: Family Medicine

## 2018-06-11 DIAGNOSIS — M069 Rheumatoid arthritis, unspecified: Secondary | ICD-10-CM | POA: Diagnosis not present

## 2018-06-11 DIAGNOSIS — M19041 Primary osteoarthritis, right hand: Secondary | ICD-10-CM | POA: Diagnosis not present

## 2018-06-11 DIAGNOSIS — I10 Essential (primary) hypertension: Secondary | ICD-10-CM | POA: Diagnosis not present

## 2018-06-11 DIAGNOSIS — M19042 Primary osteoarthritis, left hand: Secondary | ICD-10-CM | POA: Diagnosis not present

## 2018-06-11 DIAGNOSIS — M810 Age-related osteoporosis without current pathological fracture: Secondary | ICD-10-CM | POA: Diagnosis not present

## 2018-06-11 DIAGNOSIS — M1990 Unspecified osteoarthritis, unspecified site: Secondary | ICD-10-CM | POA: Diagnosis not present

## 2018-06-23 DIAGNOSIS — M549 Dorsalgia, unspecified: Secondary | ICD-10-CM | POA: Diagnosis not present

## 2018-07-01 DIAGNOSIS — B353 Tinea pedis: Secondary | ICD-10-CM | POA: Diagnosis not present

## 2018-07-01 DIAGNOSIS — D225 Melanocytic nevi of trunk: Secondary | ICD-10-CM | POA: Diagnosis not present

## 2018-07-01 DIAGNOSIS — L728 Other follicular cysts of the skin and subcutaneous tissue: Secondary | ICD-10-CM | POA: Diagnosis not present

## 2018-07-01 DIAGNOSIS — B351 Tinea unguium: Secondary | ICD-10-CM | POA: Diagnosis not present

## 2018-07-01 DIAGNOSIS — L570 Actinic keratosis: Secondary | ICD-10-CM | POA: Diagnosis not present

## 2018-07-01 DIAGNOSIS — L821 Other seborrheic keratosis: Secondary | ICD-10-CM | POA: Diagnosis not present

## 2018-07-01 DIAGNOSIS — L820 Inflamed seborrheic keratosis: Secondary | ICD-10-CM | POA: Diagnosis not present

## 2018-07-01 DIAGNOSIS — L812 Freckles: Secondary | ICD-10-CM | POA: Diagnosis not present

## 2018-07-10 DIAGNOSIS — M19042 Primary osteoarthritis, left hand: Secondary | ICD-10-CM | POA: Diagnosis not present

## 2018-07-10 DIAGNOSIS — M810 Age-related osteoporosis without current pathological fracture: Secondary | ICD-10-CM | POA: Diagnosis not present

## 2018-07-10 DIAGNOSIS — M1990 Unspecified osteoarthritis, unspecified site: Secondary | ICD-10-CM | POA: Diagnosis not present

## 2018-07-10 DIAGNOSIS — M19041 Primary osteoarthritis, right hand: Secondary | ICD-10-CM | POA: Diagnosis not present

## 2018-07-10 DIAGNOSIS — I10 Essential (primary) hypertension: Secondary | ICD-10-CM | POA: Diagnosis not present

## 2018-07-10 DIAGNOSIS — M069 Rheumatoid arthritis, unspecified: Secondary | ICD-10-CM | POA: Diagnosis not present

## 2018-07-13 DIAGNOSIS — M06041 Rheumatoid arthritis without rheumatoid factor, right hand: Secondary | ICD-10-CM | POA: Diagnosis not present

## 2018-07-18 IMAGING — DX DG THORACIC SPINE 3V
3 series · 3 of 3 positions shown · non-contrast
Comparison: None.

CLINICAL DATA: Acute right-sided thoracic pain at approximately the
T7-8 level.

EXAM:
THORACIC SPINE - 3 VIEWS

[thoracic spine ap]
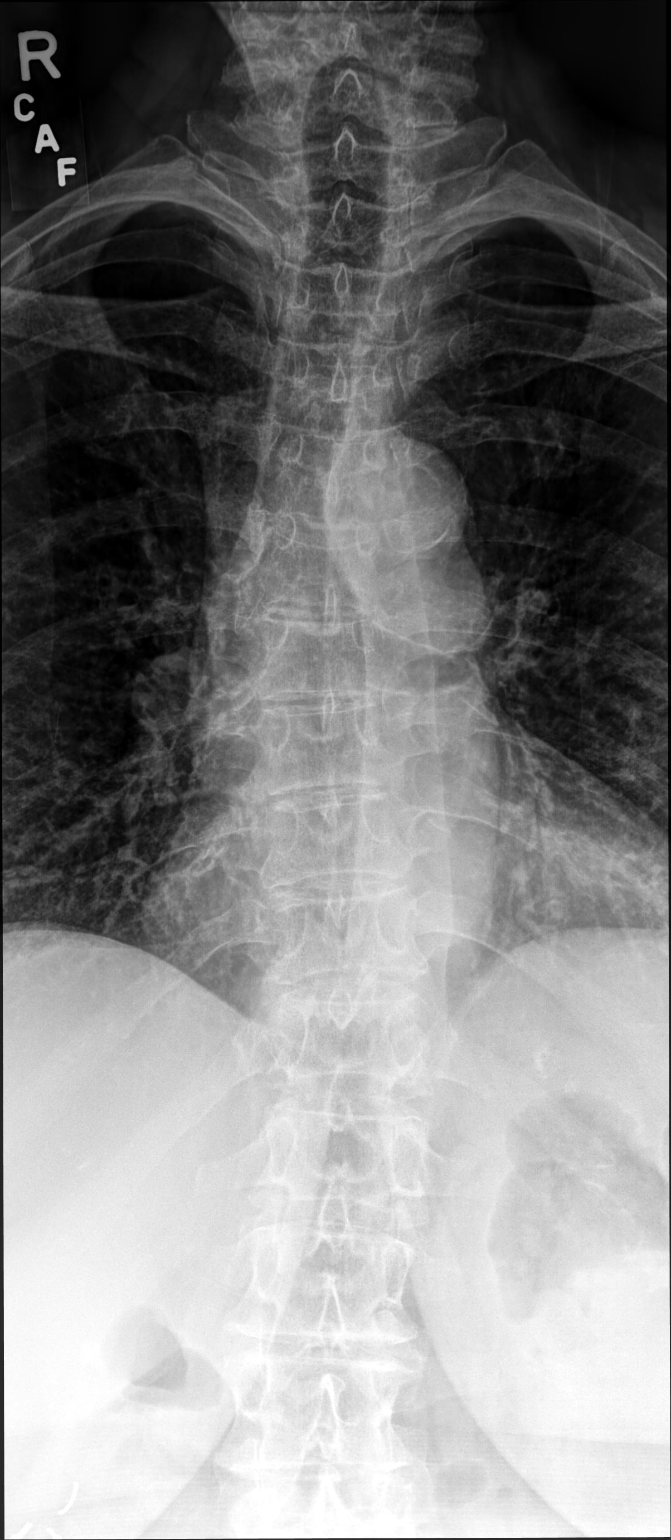

[thoracic spine lat (1 of 2)]
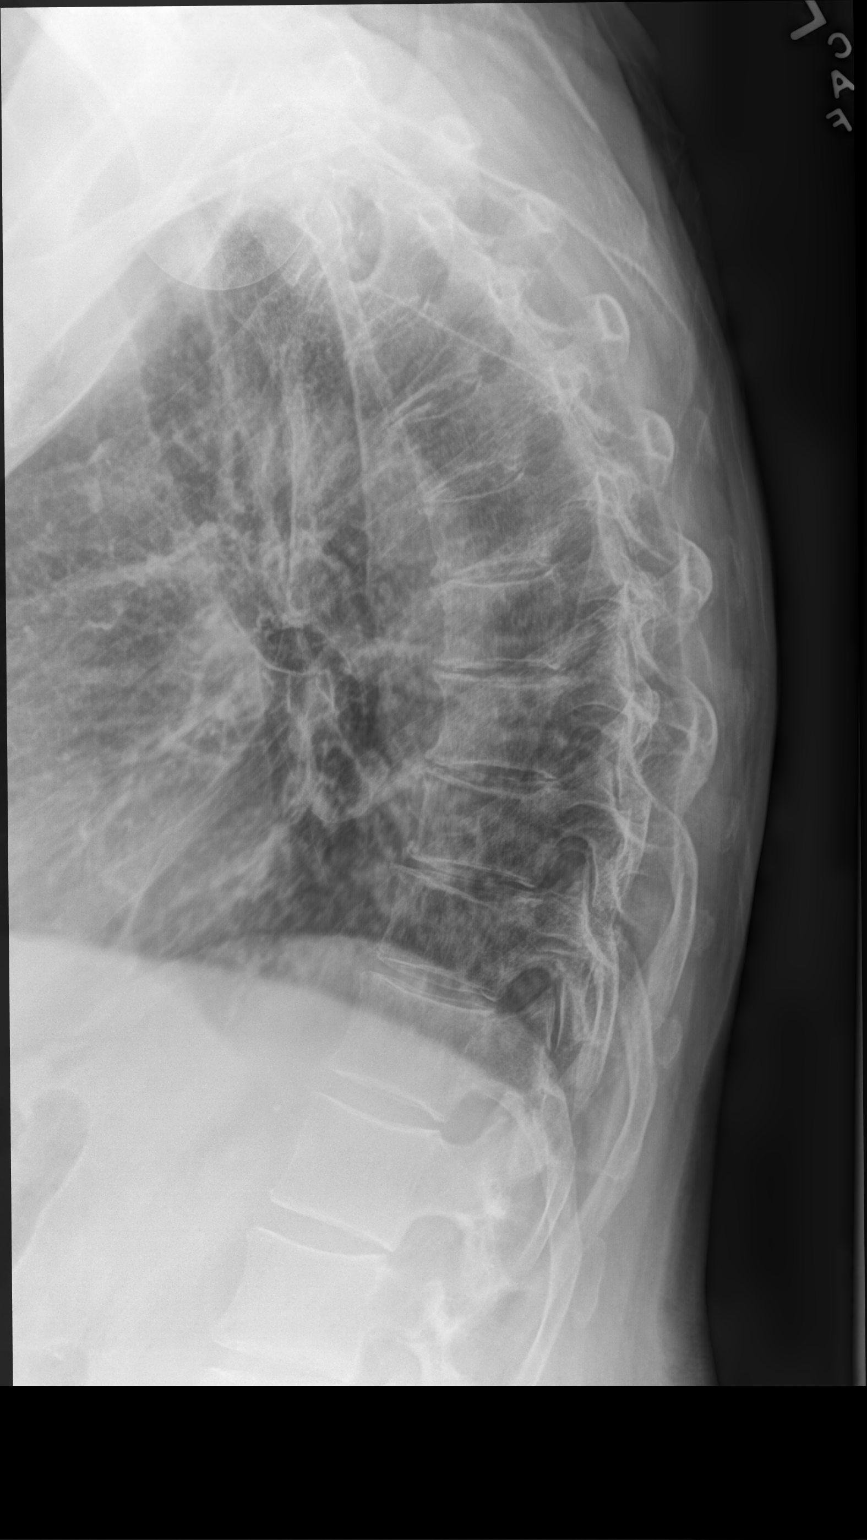

[thoracic spine lat (2 of 2)]
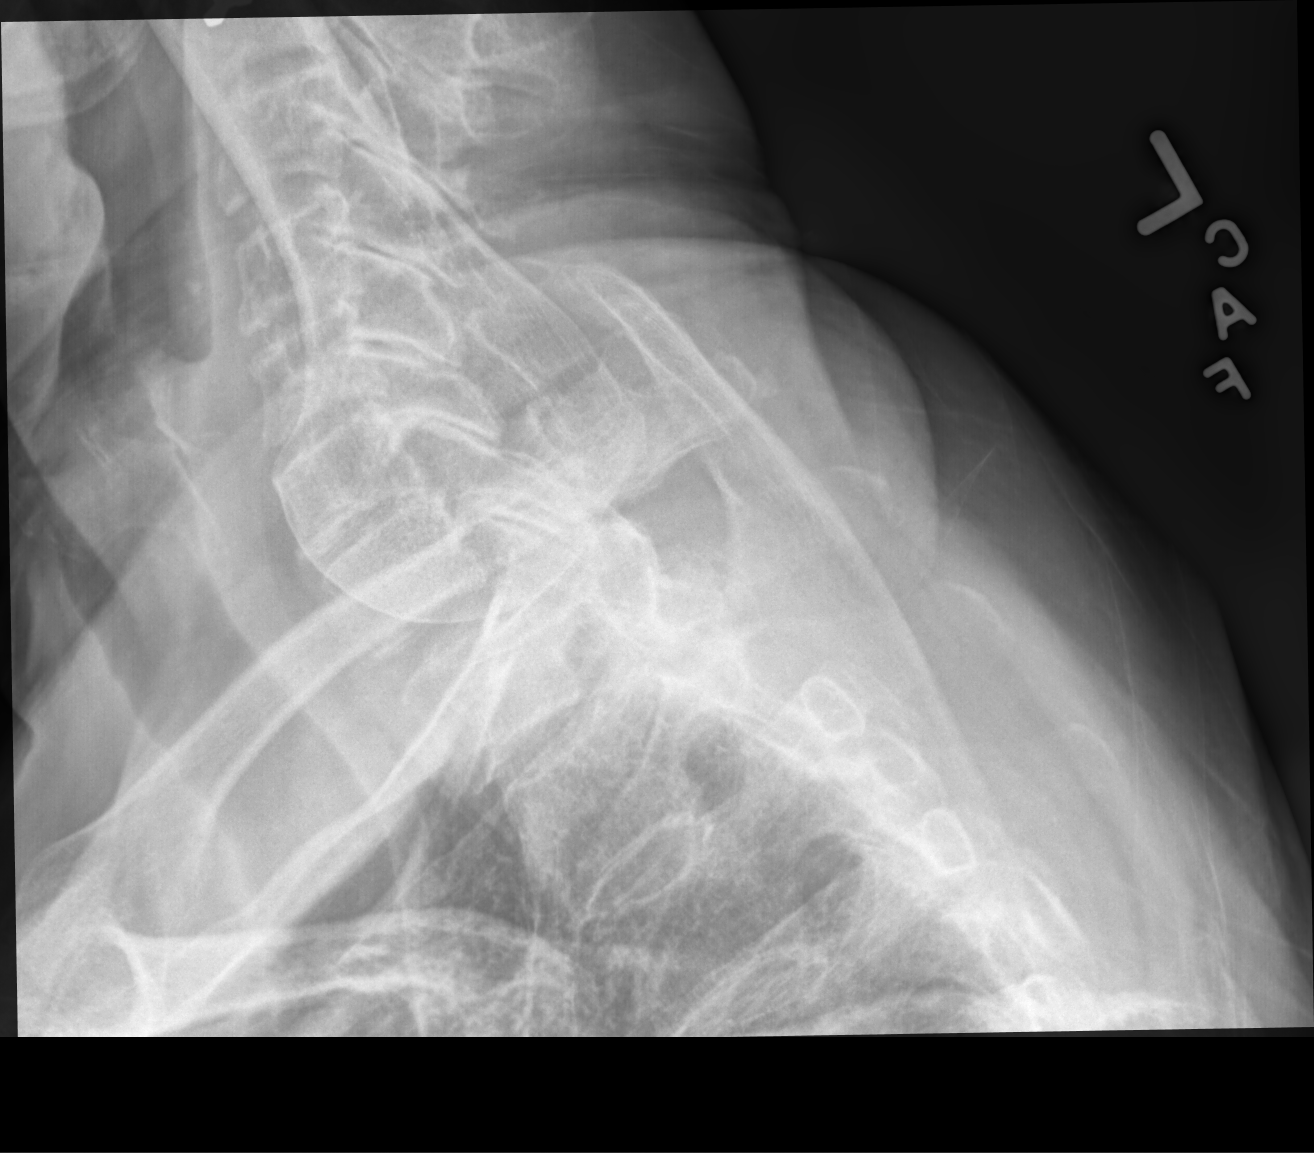

[3 of 3 positions shown; findings below may reference images not displayed]

FINDINGS: There are 11 pairs of ribs. Mild S-shaped scoliosis is convex right
in the upper thoracic spine and convex left at the thoracolumbar
junction. There is trace anterolisthesis of T1 on T2. Vertebral body
heights are preserved without evidence of fracture. Mild thoracic
spondylosis is noted. Surgical clips are present in the right upper
abdomen. Aortic atherosclerosis is noted. The lungs are hypoinflated
without gross airspace consolidation, although there is a rounded
2.3 cm density projecting over the right hilum on the AP radiograph
without clear correlate on the lateral radiograph.
IMPRESSION: 1. Mild thoracic spondylosis and mild S-shaped scoliosis.
2. 2 cm rounded density at the level the right hilum. This may
represent superimposition of normal hilar structures, however a lung
nodule or enlarged hilar lymph node are also considerations. A chest
CT is planned to further evaluate this as well as the right-sided
thoracic pain.
Study reviewed via telephone with Dr. Prawin at the time of
interpretation on 11/21/2016 at [DATE] p.m.

## 2018-07-19 IMAGING — CT CT CHEST W/ CM
2 of 3 series · 16 of 30 positions shown, 19 images · IV contrast (75CC ISOVUE 300)
Comparison: 11/21/2016

CLINICAL DATA: Lung mass seen on T-spine x-rays. Right hilar
abnormality

EXAM:
CT CHEST WITH CONTRAST
TECHNIQUE: Multidetector CT imaging of the chest was performed during
intravenous contrast administration.
CONTRAST:  75mL WCWQK7-088 IOPAMIDOL (WCWQK7-088) INJECTION 61%

[Series 3: chest with · axial · 0.74mm/px · z∈[-251,-26]mm · 10 of 111 slices shown, 13 images]
[im 11/111  mediastinal]
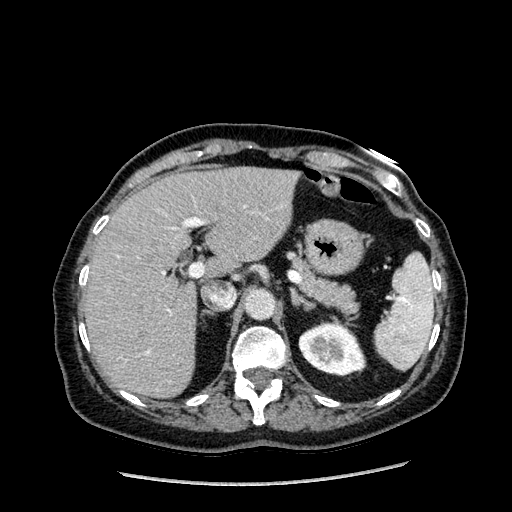
[im 11/111  lung]
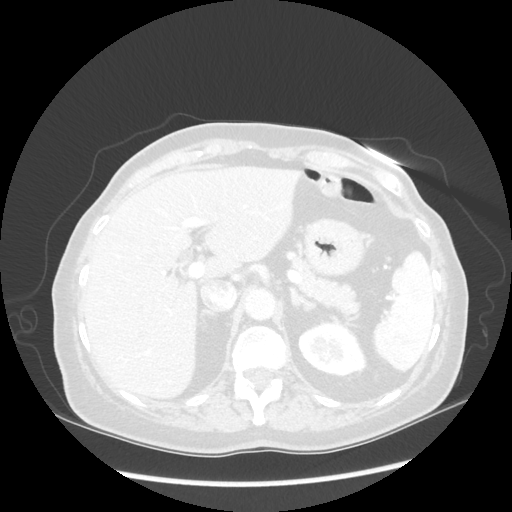
[im 21/111  lung]
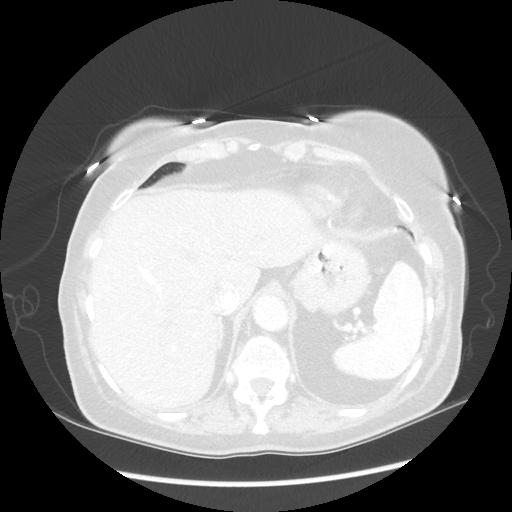
[im 31/111  lung]
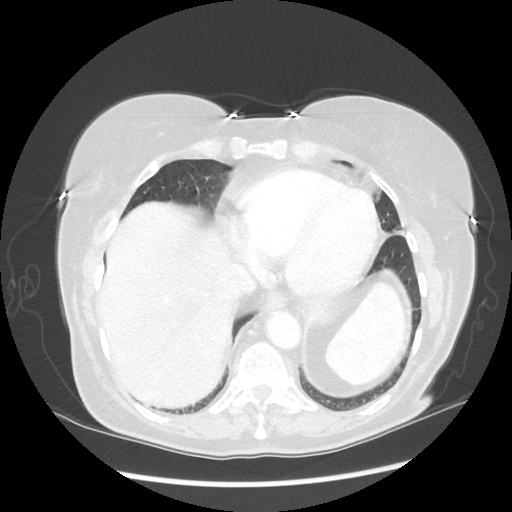
[im 41/111  lung]
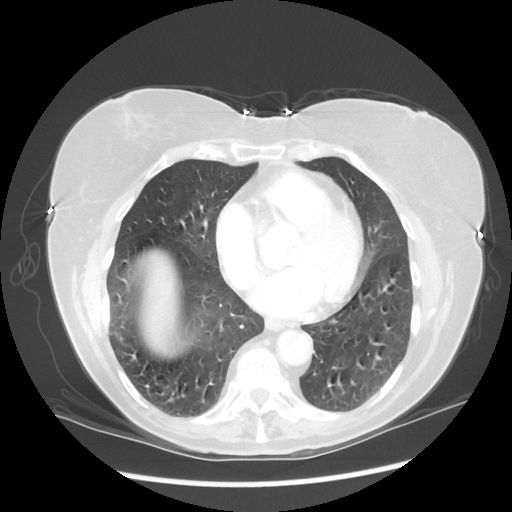
[im 51/111  mediastinal]
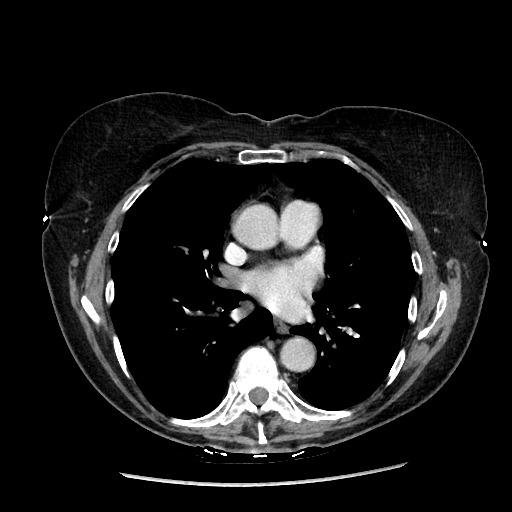
[im 51/111  lung]
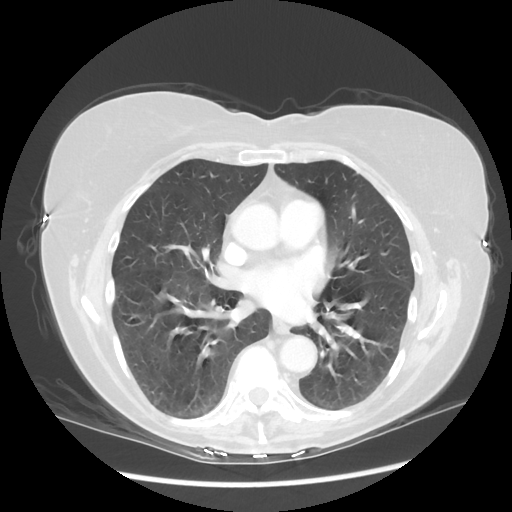
[im 61/111  lung]
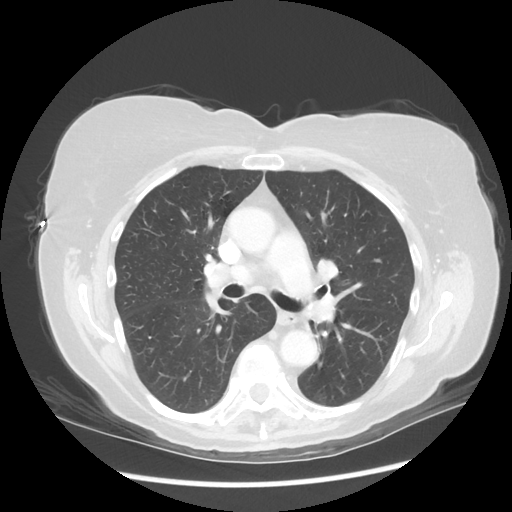
[im 71/111  lung]
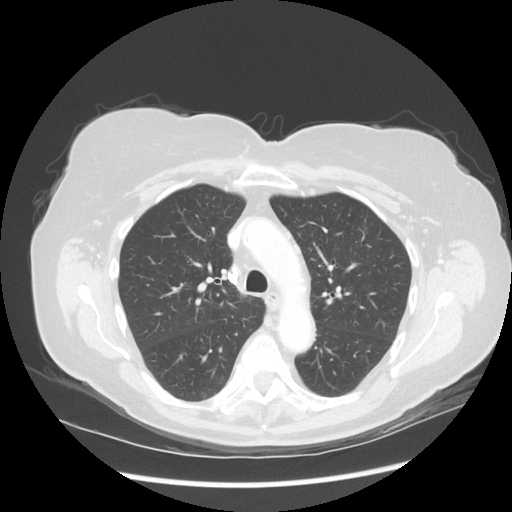
[im 81/111  lung]
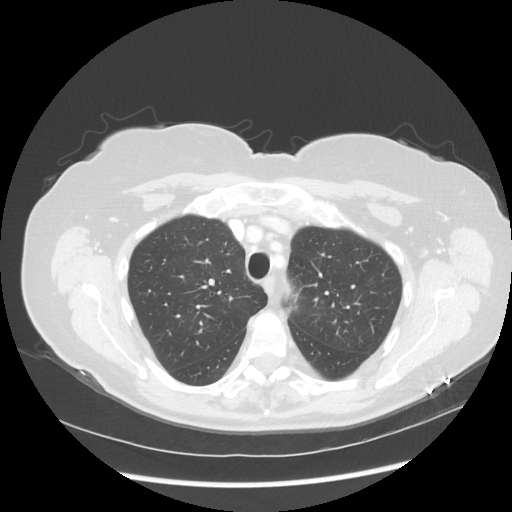
[im 91/111  mediastinal]
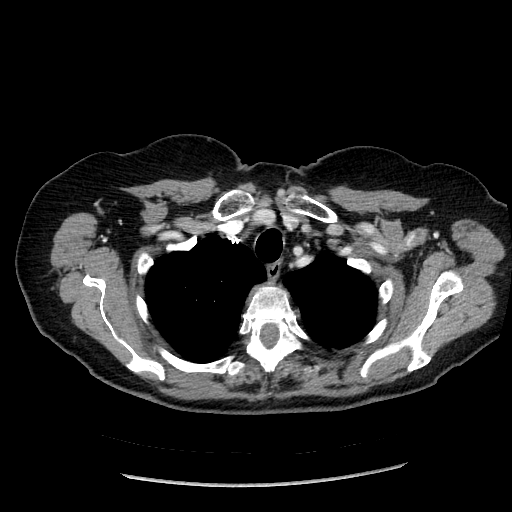
[im 91/111  lung]
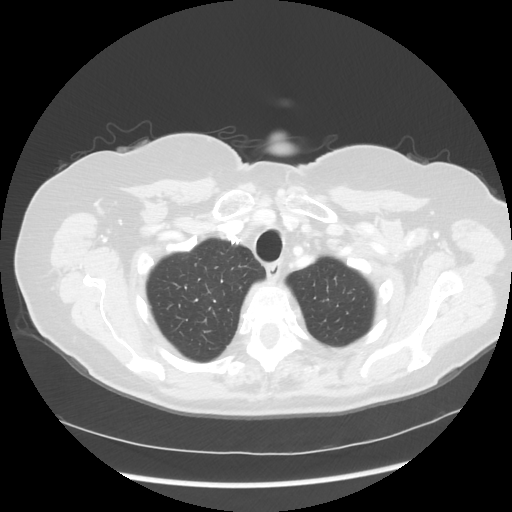
[im 101/111  lung]
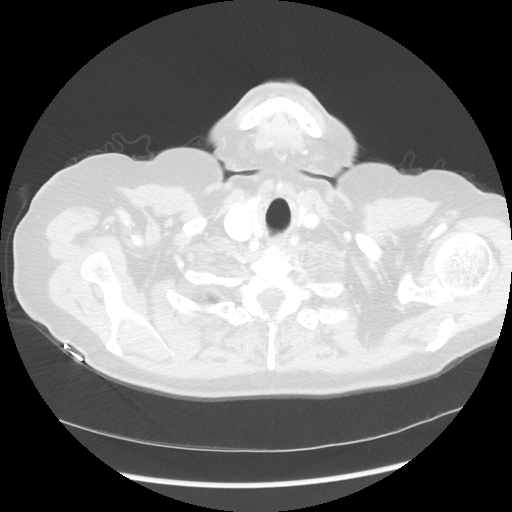

[Series 602: sagittal body · sagittal · 0.74mm/px · 6 of 153 slices shown]
[im 11/153  mediastinal]
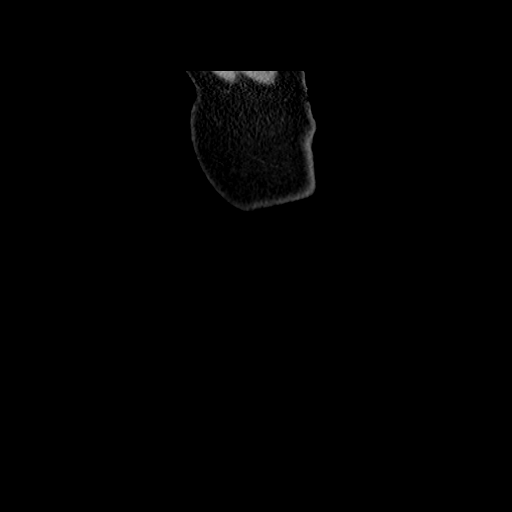
[im 31/153  mediastinal]
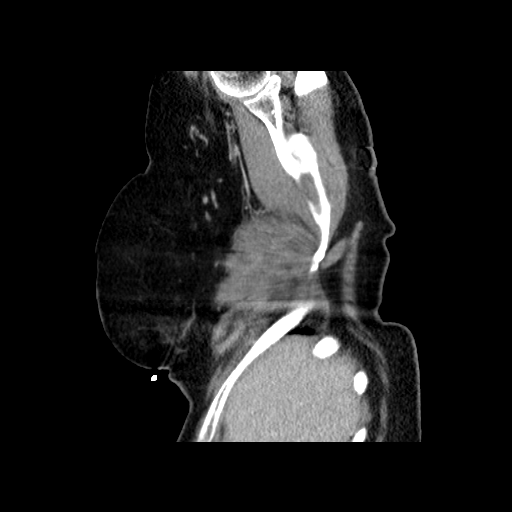
[im 51/153  mediastinal]
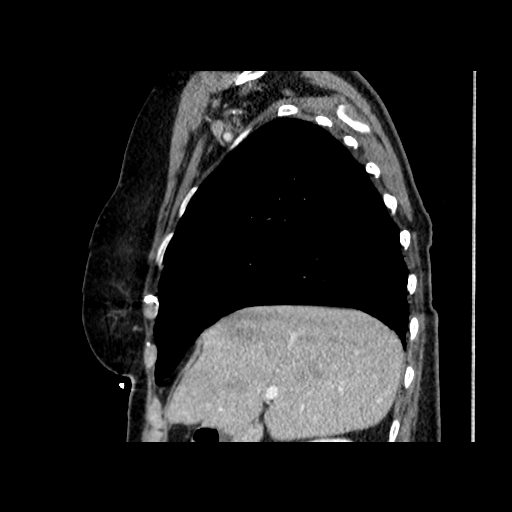
[im 71/153  mediastinal]
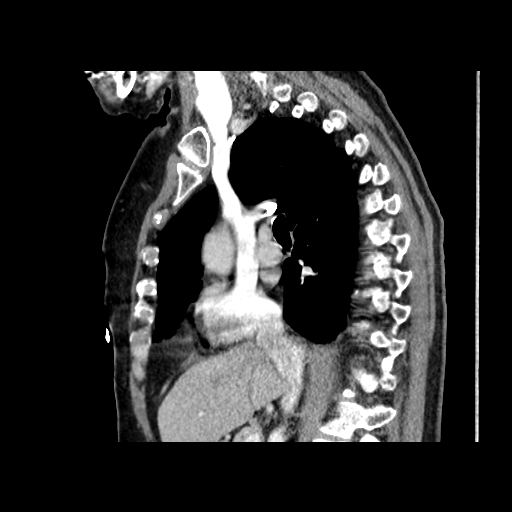
[im 82/153  mediastinal]
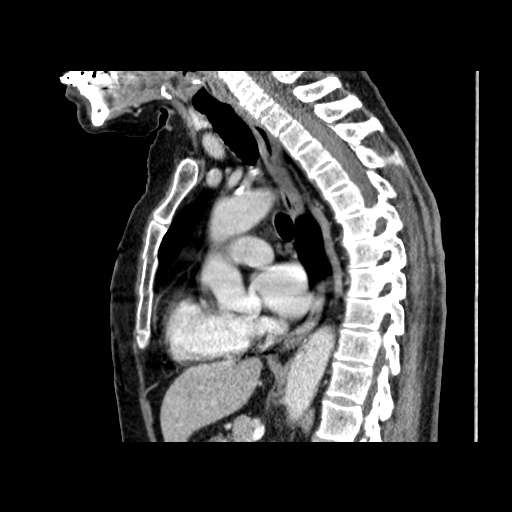
[im 102/153  mediastinal]
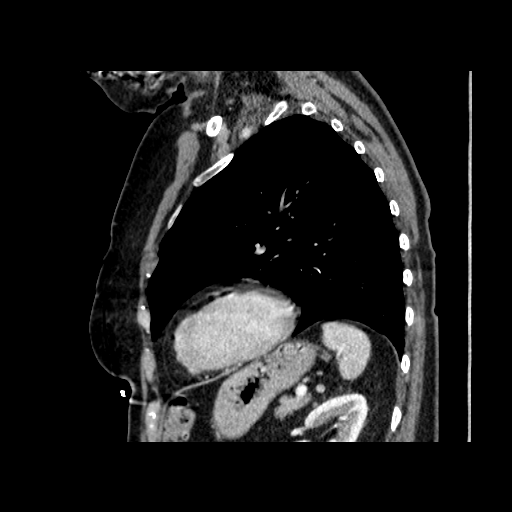

[16 of 30 positions shown; findings below may reference images not displayed]

FINDINGS: Cardiovascular: Scattered coronary artery calcifications and aortic
calcifications. No aneurysm.

Mediastinum/Nodes: No mediastinal, hilar, or axillary adenopathy. In
particular, no right hilar adenopathy or abnormality as questioned
on prior T-spine imaging. Thyroid unremarkable. Trachea and
esophagus are unremarkable.

Lungs/Pleura: Lungs are clear. No focal airspace opacities or
suspicious nodules. No effusions. No right perihilar or right lung
nodule or mass.

Upper Abdomen: Imaging into the upper abdomen shows no acute
findings.

Musculoskeletal: Chest wall soft tissues are unremarkable. No acute
bony abnormality.
IMPRESSION: No right hilar adenopathy or right lung nodule. No acute
cardiopulmonary disease.

Coronary artery disease.

Aortic Atherosclerosis ([id number]).

## 2018-08-12 DIAGNOSIS — M069 Rheumatoid arthritis, unspecified: Secondary | ICD-10-CM | POA: Diagnosis not present

## 2018-08-12 DIAGNOSIS — M810 Age-related osteoporosis without current pathological fracture: Secondary | ICD-10-CM | POA: Diagnosis not present

## 2018-08-12 DIAGNOSIS — M19042 Primary osteoarthritis, left hand: Secondary | ICD-10-CM | POA: Diagnosis not present

## 2018-08-12 DIAGNOSIS — M1990 Unspecified osteoarthritis, unspecified site: Secondary | ICD-10-CM | POA: Diagnosis not present

## 2018-08-12 DIAGNOSIS — M19041 Primary osteoarthritis, right hand: Secondary | ICD-10-CM | POA: Diagnosis not present

## 2018-08-12 DIAGNOSIS — I10 Essential (primary) hypertension: Secondary | ICD-10-CM | POA: Diagnosis not present

## 2018-09-02 DIAGNOSIS — H35373 Puckering of macula, bilateral: Secondary | ICD-10-CM | POA: Diagnosis not present

## 2018-09-02 DIAGNOSIS — Z961 Presence of intraocular lens: Secondary | ICD-10-CM | POA: Diagnosis not present

## 2018-09-02 DIAGNOSIS — H04123 Dry eye syndrome of bilateral lacrimal glands: Secondary | ICD-10-CM | POA: Diagnosis not present

## 2018-09-02 DIAGNOSIS — H35363 Drusen (degenerative) of macula, bilateral: Secondary | ICD-10-CM | POA: Diagnosis not present

## 2018-09-04 DIAGNOSIS — Z1231 Encounter for screening mammogram for malignant neoplasm of breast: Secondary | ICD-10-CM | POA: Diagnosis not present

## 2018-09-15 DIAGNOSIS — M1990 Unspecified osteoarthritis, unspecified site: Secondary | ICD-10-CM | POA: Diagnosis not present

## 2018-09-15 DIAGNOSIS — I10 Essential (primary) hypertension: Secondary | ICD-10-CM | POA: Diagnosis not present

## 2018-09-15 DIAGNOSIS — M19041 Primary osteoarthritis, right hand: Secondary | ICD-10-CM | POA: Diagnosis not present

## 2018-09-15 DIAGNOSIS — M069 Rheumatoid arthritis, unspecified: Secondary | ICD-10-CM | POA: Diagnosis not present

## 2018-09-15 DIAGNOSIS — M19042 Primary osteoarthritis, left hand: Secondary | ICD-10-CM | POA: Diagnosis not present

## 2018-09-15 DIAGNOSIS — M810 Age-related osteoporosis without current pathological fracture: Secondary | ICD-10-CM | POA: Diagnosis not present

## 2018-10-01 DIAGNOSIS — M06041 Rheumatoid arthritis without rheumatoid factor, right hand: Secondary | ICD-10-CM | POA: Diagnosis not present

## 2018-10-08 DIAGNOSIS — I10 Essential (primary) hypertension: Secondary | ICD-10-CM | POA: Diagnosis not present

## 2018-10-08 DIAGNOSIS — M19042 Primary osteoarthritis, left hand: Secondary | ICD-10-CM | POA: Diagnosis not present

## 2018-10-08 DIAGNOSIS — M069 Rheumatoid arthritis, unspecified: Secondary | ICD-10-CM | POA: Diagnosis not present

## 2018-10-08 DIAGNOSIS — M19041 Primary osteoarthritis, right hand: Secondary | ICD-10-CM | POA: Diagnosis not present

## 2018-10-08 DIAGNOSIS — M1990 Unspecified osteoarthritis, unspecified site: Secondary | ICD-10-CM | POA: Diagnosis not present

## 2018-10-08 DIAGNOSIS — M810 Age-related osteoporosis without current pathological fracture: Secondary | ICD-10-CM | POA: Diagnosis not present

## 2018-11-12 DIAGNOSIS — M810 Age-related osteoporosis without current pathological fracture: Secondary | ICD-10-CM | POA: Diagnosis not present

## 2018-11-12 DIAGNOSIS — I10 Essential (primary) hypertension: Secondary | ICD-10-CM | POA: Diagnosis not present

## 2018-11-12 DIAGNOSIS — M19042 Primary osteoarthritis, left hand: Secondary | ICD-10-CM | POA: Diagnosis not present

## 2018-11-12 DIAGNOSIS — M19041 Primary osteoarthritis, right hand: Secondary | ICD-10-CM | POA: Diagnosis not present

## 2018-11-12 DIAGNOSIS — M069 Rheumatoid arthritis, unspecified: Secondary | ICD-10-CM | POA: Diagnosis not present

## 2018-11-12 DIAGNOSIS — M1990 Unspecified osteoarthritis, unspecified site: Secondary | ICD-10-CM | POA: Diagnosis not present

## 2018-12-08 DIAGNOSIS — M1990 Unspecified osteoarthritis, unspecified site: Secondary | ICD-10-CM | POA: Diagnosis not present

## 2018-12-08 DIAGNOSIS — M810 Age-related osteoporosis without current pathological fracture: Secondary | ICD-10-CM | POA: Diagnosis not present

## 2018-12-08 DIAGNOSIS — M19042 Primary osteoarthritis, left hand: Secondary | ICD-10-CM | POA: Diagnosis not present

## 2018-12-08 DIAGNOSIS — I10 Essential (primary) hypertension: Secondary | ICD-10-CM | POA: Diagnosis not present

## 2018-12-08 DIAGNOSIS — M069 Rheumatoid arthritis, unspecified: Secondary | ICD-10-CM | POA: Diagnosis not present

## 2018-12-08 DIAGNOSIS — M19041 Primary osteoarthritis, right hand: Secondary | ICD-10-CM | POA: Diagnosis not present

## 2018-12-28 DIAGNOSIS — Z23 Encounter for immunization: Secondary | ICD-10-CM | POA: Diagnosis not present

## 2018-12-31 ENCOUNTER — Ambulatory Visit: Payer: Medicare Other | Admitting: Family Medicine

## 2018-12-31 DIAGNOSIS — M19032 Primary osteoarthritis, left wrist: Secondary | ICD-10-CM | POA: Diagnosis not present

## 2018-12-31 DIAGNOSIS — M19041 Primary osteoarthritis, right hand: Secondary | ICD-10-CM | POA: Diagnosis not present

## 2018-12-31 DIAGNOSIS — M19031 Primary osteoarthritis, right wrist: Secondary | ICD-10-CM | POA: Diagnosis not present

## 2018-12-31 DIAGNOSIS — M19042 Primary osteoarthritis, left hand: Secondary | ICD-10-CM | POA: Diagnosis not present

## 2019-01-01 ENCOUNTER — Ambulatory Visit: Payer: Medicare Other | Admitting: Family Medicine

## 2019-01-11 DIAGNOSIS — M06042 Rheumatoid arthritis without rheumatoid factor, left hand: Secondary | ICD-10-CM | POA: Diagnosis not present

## 2019-01-11 DIAGNOSIS — M06041 Rheumatoid arthritis without rheumatoid factor, right hand: Secondary | ICD-10-CM | POA: Diagnosis not present

## 2019-01-13 DIAGNOSIS — M19042 Primary osteoarthritis, left hand: Secondary | ICD-10-CM | POA: Diagnosis not present

## 2019-01-13 DIAGNOSIS — M1990 Unspecified osteoarthritis, unspecified site: Secondary | ICD-10-CM | POA: Diagnosis not present

## 2019-01-13 DIAGNOSIS — M19041 Primary osteoarthritis, right hand: Secondary | ICD-10-CM | POA: Diagnosis not present

## 2019-01-13 DIAGNOSIS — M810 Age-related osteoporosis without current pathological fracture: Secondary | ICD-10-CM | POA: Diagnosis not present

## 2019-01-13 DIAGNOSIS — M069 Rheumatoid arthritis, unspecified: Secondary | ICD-10-CM | POA: Diagnosis not present

## 2019-01-13 DIAGNOSIS — I10 Essential (primary) hypertension: Secondary | ICD-10-CM | POA: Diagnosis not present

## 2019-01-15 DIAGNOSIS — Z1212 Encounter for screening for malignant neoplasm of rectum: Secondary | ICD-10-CM | POA: Diagnosis not present

## 2019-01-15 DIAGNOSIS — Z1211 Encounter for screening for malignant neoplasm of colon: Secondary | ICD-10-CM | POA: Diagnosis not present

## 2019-01-18 DIAGNOSIS — M2550 Pain in unspecified joint: Secondary | ICD-10-CM | POA: Diagnosis not present

## 2019-01-18 DIAGNOSIS — R5383 Other fatigue: Secondary | ICD-10-CM | POA: Diagnosis not present

## 2019-01-18 DIAGNOSIS — Z6824 Body mass index (BMI) 24.0-24.9, adult: Secondary | ICD-10-CM | POA: Diagnosis not present

## 2019-01-18 DIAGNOSIS — E663 Overweight: Secondary | ICD-10-CM | POA: Diagnosis not present

## 2019-01-18 DIAGNOSIS — M06041 Rheumatoid arthritis without rheumatoid factor, right hand: Secondary | ICD-10-CM | POA: Diagnosis not present

## 2019-01-18 DIAGNOSIS — H04123 Dry eye syndrome of bilateral lacrimal glands: Secondary | ICD-10-CM | POA: Diagnosis not present

## 2019-01-18 DIAGNOSIS — M25552 Pain in left hip: Secondary | ICD-10-CM | POA: Diagnosis not present

## 2019-01-18 DIAGNOSIS — Z79899 Other long term (current) drug therapy: Secondary | ICD-10-CM | POA: Diagnosis not present

## 2019-01-18 DIAGNOSIS — M06042 Rheumatoid arthritis without rheumatoid factor, left hand: Secondary | ICD-10-CM | POA: Diagnosis not present

## 2019-01-18 DIAGNOSIS — M150 Primary generalized (osteo)arthritis: Secondary | ICD-10-CM | POA: Diagnosis not present

## 2019-02-16 DIAGNOSIS — M069 Rheumatoid arthritis, unspecified: Secondary | ICD-10-CM | POA: Diagnosis not present

## 2019-02-16 DIAGNOSIS — I10 Essential (primary) hypertension: Secondary | ICD-10-CM | POA: Diagnosis not present

## 2019-02-16 DIAGNOSIS — M19041 Primary osteoarthritis, right hand: Secondary | ICD-10-CM | POA: Diagnosis not present

## 2019-02-16 DIAGNOSIS — M810 Age-related osteoporosis without current pathological fracture: Secondary | ICD-10-CM | POA: Diagnosis not present

## 2019-02-16 DIAGNOSIS — M19042 Primary osteoarthritis, left hand: Secondary | ICD-10-CM | POA: Diagnosis not present

## 2019-02-16 DIAGNOSIS — M1990 Unspecified osteoarthritis, unspecified site: Secondary | ICD-10-CM | POA: Diagnosis not present

## 2019-02-22 DIAGNOSIS — M19032 Primary osteoarthritis, left wrist: Secondary | ICD-10-CM | POA: Diagnosis not present

## 2019-02-22 DIAGNOSIS — M19031 Primary osteoarthritis, right wrist: Secondary | ICD-10-CM | POA: Diagnosis not present

## 2019-03-11 DIAGNOSIS — E7800 Pure hypercholesterolemia, unspecified: Secondary | ICD-10-CM | POA: Diagnosis not present

## 2019-03-11 DIAGNOSIS — R519 Headache, unspecified: Secondary | ICD-10-CM | POA: Diagnosis not present

## 2019-03-11 DIAGNOSIS — G4700 Insomnia, unspecified: Secondary | ICD-10-CM | POA: Diagnosis not present

## 2019-03-11 DIAGNOSIS — M810 Age-related osteoporosis without current pathological fracture: Secondary | ICD-10-CM | POA: Diagnosis not present

## 2019-03-23 DIAGNOSIS — M8949 Other hypertrophic osteoarthropathy, multiple sites: Secondary | ICD-10-CM | POA: Diagnosis not present

## 2019-03-23 DIAGNOSIS — M19041 Primary osteoarthritis, right hand: Secondary | ICD-10-CM | POA: Diagnosis not present

## 2019-03-23 DIAGNOSIS — M069 Rheumatoid arthritis, unspecified: Secondary | ICD-10-CM | POA: Diagnosis not present

## 2019-03-23 DIAGNOSIS — M0579 Rheumatoid arthritis with rheumatoid factor of multiple sites without organ or systems involvement: Secondary | ICD-10-CM | POA: Diagnosis not present

## 2019-03-23 DIAGNOSIS — M1990 Unspecified osteoarthritis, unspecified site: Secondary | ICD-10-CM | POA: Diagnosis not present

## 2019-03-23 DIAGNOSIS — M810 Age-related osteoporosis without current pathological fracture: Secondary | ICD-10-CM | POA: Diagnosis not present

## 2019-03-23 DIAGNOSIS — M19042 Primary osteoarthritis, left hand: Secondary | ICD-10-CM | POA: Diagnosis not present

## 2019-03-23 DIAGNOSIS — I10 Essential (primary) hypertension: Secondary | ICD-10-CM | POA: Diagnosis not present

## 2019-03-24 ENCOUNTER — Other Ambulatory Visit: Payer: Self-pay

## 2019-03-25 ENCOUNTER — Other Ambulatory Visit: Payer: Self-pay | Admitting: Family Medicine

## 2019-10-11 ENCOUNTER — Ambulatory Visit (INDEPENDENT_AMBULATORY_CARE_PROVIDER_SITE_OTHER): Payer: Medicare Other | Admitting: Family Medicine

## 2019-10-11 ENCOUNTER — Other Ambulatory Visit: Payer: Self-pay

## 2019-10-11 ENCOUNTER — Encounter: Payer: Self-pay | Admitting: Family Medicine

## 2019-10-11 VITALS — BP 100/58 | HR 73 | Temp 97.2°F | Ht 65.0 in | Wt 146.2 lb

## 2019-10-11 DIAGNOSIS — J3489 Other specified disorders of nose and nasal sinuses: Secondary | ICD-10-CM | POA: Diagnosis not present

## 2019-10-11 MED ORDER — MUPIROCIN 2 % EX OINT: 1.0000 "application " | TOPICAL_OINTMENT | Freq: Two times a day (BID) | CUTANEOUS | 0 refills | Status: AC

## 2019-10-11 NOTE — Progress Notes (Signed)
New Patient Office Visit  Subjective:  Patient ID: Jennifer Boone, female    DOB: 1933-08-29  Age: 86 y.o. MRN: [id number]  CC:  Chief Complaint  Patient presents with  . Nasal Blisters    Patient is here today for an acute visit for nasal blisters. It started with a sore in her nose and she used vaseline. She then looked in there and saw a bunch of sores and blisters so she started using neosporin for the last 4-5 days and it has helped. She is on Methotrexate and it sometimes can cause sores in the mouth so she wanted to get it checked out.    HPI Rhyli Depaula presents for evaluation 0.4-day history of blisters in her right nare.  These seem to have resolved but there is some residual burning.  There is no antecedent pain.  No history of statin.  No fevers chills nausea or vomiting.  Past Medical History:  Diagnosis Date  . Allergy   . Arrhythmia   . Arthritis   . GERD (gastroesophageal reflux disease)   . Hyperlipidemia   . Hypertension   . IBS (irritable bowel syndrome)   . Rheumatic fever    Childhood  . 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 ABDOMINAL HYSTERECTOMY    . APPENDECTOMY    . BOWEL RESECTION    . CATARACT EXTRACTION    . CHOLECYSTECTOMY    . HEMORROIDECTOMY    . ROTATOR CUFF REPAIR    . TONSILLECTOMY      Family History  Problem Relation Age of Onset  . Heart disease Mother   . Diabetes Sister   . Heart disease Sister     Social History   Socioeconomic History  . Marital status: Married    Spouse name: Not on file  . Number of children: Not on file  . Years of education: Not on file  . Highest education level: Not on file  Occupational History  . Not on file  Tobacco Use  . Smoking status: Former Smoker    Types: Cigarettes    Quit date: 04/29/1976    Years since quitting: 43.4  . Smokeless tobacco: Never Used  Vaping Use  . Vaping Use: Never used  Substance and Sexual Activity  .  Alcohol use: No  . Drug use: No  . Sexual activity: Never  Other Topics Concern  . Not on file  Social History Narrative  . Not on file   Social Determinants of Health   Financial Resource Strain:   . Difficulty of Paying Living Expenses:   Food Insecurity:   . Worried About Charity fundraiser in the Last Year:   . Arboriculturist in the Last Year:   Transportation Needs:   . Film/video editor (Medical):   Marland Kitchen Lack of Transportation (Non-Medical):   Physical Activity:   . Days of Exercise per Week:   . Minutes of Exercise per Session:   Stress:   . Feeling of Stress :   Social Connections:   . Frequency of Communication with Friends and Family:   . Frequency of Social Gatherings with Friends and Family:   . Attends Religious Services:   . Active Member of Clubs or Organizations:   . Attends Archivist Meetings:   Marland Kitchen Marital Status:   Intimate Partner Violence:   . Fear of Current or Ex-Partner:   .  Emotionally Abused:   Marland Kitchen Physically Abused:   . Sexually Abused:     ROS Review of Systems  Constitutional: Negative for chills, diaphoresis, fatigue, fever and unexpected weight change.  HENT: Negative for nosebleeds, postnasal drip and rhinorrhea.   Eyes: Negative for photophobia and visual disturbance.  Respiratory: Negative.   Cardiovascular: Negative.   Gastrointestinal: Negative.   Genitourinary: Negative.   Skin: Positive for wound. Negative for pallor.  Psychiatric/Behavioral: Negative.     Objective:   Today's Vitals: BP (!) 100/58 (BP Location: Left Arm, Patient Position: Sitting, Cuff Size: Normal)   Pulse 73   Temp (!) 97.2 F (36.2 C) (Temporal)   Ht 5\' 5"  (1.651 m)   Wt 146 lb 3.2 oz (66.3 kg)   SpO2 95%   BMI 24.33 kg/m   Physical Exam Vitals and nursing note reviewed.  Constitutional:      General: She is not in acute distress.    Appearance: Normal appearance. She is normal weight. She is not ill-appearing, toxic-appearing or  diaphoretic.  HENT:     Head: Normocephalic and atraumatic.     Right Ear: External ear normal.     Left Ear: External ear normal.     Nose: Nasal tenderness present. No signs of injury, laceration, mucosal edema, congestion or rhinorrhea.     Right Nostril: No epistaxis.      Mouth/Throat:     Mouth: Mucous membranes are moist.     Pharynx: No oropharyngeal exudate or posterior oropharyngeal erythema.  Eyes:     General: No scleral icterus.       Right eye: No discharge.        Left eye: No discharge.     Extraocular Movements: Extraocular movements intact.     Conjunctiva/sclera: Conjunctivae normal.     Pupils: Pupils are equal, round, and reactive to light.  Cardiovascular:     Rate and Rhythm: Normal rate and regular rhythm.  Pulmonary:     Effort: Pulmonary effort is normal.     Breath sounds: Normal breath sounds.  Musculoskeletal:     Cervical back: No rigidity or tenderness.  Lymphadenopathy:     Cervical: No cervical adenopathy.  Skin:    General: Skin is warm and dry.  Neurological:     Mental Status: She is alert and oriented to person, place, and time.  Psychiatric:        Mood and Affect: Mood normal.        Behavior: Behavior normal.     Assessment & Plan:   Problem List Items Addressed This Visit    None    Visit Diagnoses    Nasal discomfort    -  Primary   Relevant Medications   mupirocin ointment (BACTROBAN) 2 %   Other Relevant Orders   MRSA culture      Outpatient Encounter Medications as of 10/11/2019  Medication Sig  . cholecalciferol (VITAMIN D) 400 units TABS tablet Take 400 Units by mouth.  . Coenzyme Q10 (COQ-10) 100 MG CAPS Take by mouth.  . FIBER COMPLETE PO Take by mouth.  . folic acid (FOLVITE) 1 MG tablet Take 3 mg by mouth daily.   Marland Kitchen glucosamine-chondroitin 500-400 MG tablet Take 1 tablet by mouth 3 (three) times daily.    . methotrexate (50 MG/ML) 1 g injection Inject into the vein once.  . Multiple Vitamins-Minerals  (PRESERVISION AREDS 2 PO) Take by mouth.  . propranolol (INDERAL) 10 MG tablet TAKE 1 TABLET(10 MG) BY MOUTH  DAILY  . simvastatin (ZOCOR) 40 MG tablet Take 40 mg by mouth at bedtime.    . SUMAtriptan (IMITREX) 50 MG tablet Take 1 tablet (50 mg total) by mouth every 2 (two) hours as needed for migraine. May repeat in 2 hours if headache persists or recurs.  . mupirocin ointment (BACTROBAN) 2 % Place 1 application into the nose 2 (two) times daily. For 5 days.  . [DISCONTINUED] methotrexate (RHEUMATREX) 2.5 MG tablet Take 5 tablets by mouth once weekly.   No facility-administered encounter medications on file as of 10/11/2019.    Follow-up: Return if symptoms worsen or fail to improve.   Libby Maw, MD

## 2019-10-13 LAB — MRSA CULTURE

## 2019-10-19 ENCOUNTER — Telehealth: Payer: Self-pay | Admitting: Family Medicine

## 2019-10-19 NOTE — Telephone Encounter (Signed)
Antibiotics were given to patient with instructions to use for 5 days. That ended last Saturday and patient still has pain and a sore inside her nose. Please advise.

## 2019-10-19 NOTE — Telephone Encounter (Signed)
Will need to take another look.

## 2019-10-19 NOTE — Telephone Encounter (Signed)
Dr. Ethelene Hal please advise patient states that she have completed antibiotics 3 days ago and she does not see any improvement still having pain in nose.

## 2019-10-19 NOTE — Telephone Encounter (Signed)
Called patient to schedule appointment to come in for follow up. No answer LMTCB

## 2019-10-21 ENCOUNTER — Other Ambulatory Visit: Payer: Self-pay

## 2019-10-22 ENCOUNTER — Ambulatory Visit (INDEPENDENT_AMBULATORY_CARE_PROVIDER_SITE_OTHER): Payer: Medicare Other | Admitting: Family Medicine

## 2019-10-22 ENCOUNTER — Encounter: Payer: Self-pay | Admitting: Family Medicine

## 2019-10-22 VITALS — BP 120/80 | HR 60 | Temp 97.4°F | Ht 65.0 in | Wt 148.8 lb

## 2019-10-22 DIAGNOSIS — J3489 Other specified disorders of nose and nasal sinuses: Secondary | ICD-10-CM

## 2019-10-22 NOTE — Patient Instructions (Signed)
Use bactroban twice per day for 5 more days then stop Symptoms should resolve in 7-10 days If it does not resolve, call office

## 2019-10-22 NOTE — Progress Notes (Addendum)
Jennifer Boone is a 86 y.o. female  Chief Complaint  Patient presents with  . Acute Visit    Pt here for a 1week follow up for nasal discomfort/sore inside rt nosral.  Pt has been using medication given and 90% better but it is still there.    HPI: Jennifer Boone is a 86 y.o. female who saw her PCP Dr. Ethelene Hal on 10/11/19 for a sore in her Rt nares. She was given 5 day course of bactroban BID which she completed.  Today she reports affected are is 90% better but is still present. No pain or discomfort. No fever, chills. No epistaxis in the past week. No nasal congestion. She endorses rhinorrhea in AM x 1 year that improves once she blows her nose.    Pts PMHx is significant for RA for which she is on methotrexate.   Past Medical History:  Diagnosis Date  . Allergy   . Arrhythmia   . Arthritis   . GERD (gastroesophageal reflux disease)   . Hyperlipidemia   . Hypertension   . IBS (irritable bowel syndrome)   . Rheumatic fever    Childhood  . 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 ABDOMINAL HYSTERECTOMY    . APPENDECTOMY    . BOWEL RESECTION    . CATARACT EXTRACTION    . CHOLECYSTECTOMY    . HEMORROIDECTOMY    . ROTATOR CUFF REPAIR    . TONSILLECTOMY      Social History   Socioeconomic History  . Marital status: Married    Spouse name: Not on file  . Number of children: Not on file  . Years of education: Not on file  . Highest education level: Not on file  Occupational History  . Not on file  Tobacco Use  . Smoking status: Former Smoker    Types: Cigarettes    Quit date: 04/29/1976    Years since quitting: 43.5  . Smokeless tobacco: Never Used  Vaping Use  . Vaping Use: Never used  Substance and Sexual Activity  . Alcohol use: No  . Drug use: No  . Sexual activity: Never  Other Topics Concern  . Not on file  Social History Narrative  . Not on file   Social Determinants of Health   Financial Resource  Strain:   . Difficulty of Paying Living Expenses:   Food Insecurity:   . Worried About Charity fundraiser in the Last Year:   . Arboriculturist in the Last Year:   Transportation Needs:   . Film/video editor (Medical):   Marland Kitchen Lack of Transportation (Non-Medical):   Physical Activity:   . Days of Exercise per Week:   . Minutes of Exercise per Session:   Stress:   . Feeling of Stress :   Social Connections:   . Frequency of Communication with Friends and Family:   . Frequency of Social Gatherings with Friends and Family:   . Attends Religious Services:   . Active Member of Clubs or Organizations:   . Attends Archivist Meetings:   Marland Kitchen Marital Status:   Intimate Partner Violence:   . Fear of Current or Ex-Partner:   . Emotionally Abused:   Marland Kitchen Physically Abused:   . Sexually Abused:     Family History  Problem Relation Age of Onset  . Heart disease Mother   . Diabetes Sister   . Heart  disease Sister      Immunization History  Administered Date(s) Administered  . Influenza, High Dose Seasonal PF 01/23/2016, 01/07/2017, 01/16/2018  . Influenza,inj,Quad PF,6+ Mos 01/17/2015  . Pneumococcal-Unspecified 05/09/2008  . Tdap 04/08/2016  . Zoster 06/12/2010    Outpatient Encounter Medications as of 10/22/2019  Medication Sig  . cholecalciferol (VITAMIN D) 400 units TABS tablet Take 400 Units by mouth.  . Coenzyme Q10 (COQ-10) 100 MG CAPS Take by mouth.  . FIBER COMPLETE PO Take by mouth.  . folic acid (FOLVITE) 1 MG tablet Take 3 mg by mouth daily.   Marland Kitchen glucosamine-chondroitin 500-400 MG tablet Take 1 tablet by mouth 3 (three) times daily.    . methotrexate (50 MG/ML) 1 g injection Inject into the vein once.  . Multiple Vitamins-Minerals (PRESERVISION AREDS 2 PO) Take by mouth.  . mupirocin ointment (BACTROBAN) 2 % Place 1 application into the nose 2 (two) times daily. For 5 days.  . propranolol (INDERAL) 10 MG tablet TAKE 1 TABLET(10 MG) BY MOUTH DAILY  . simvastatin  (ZOCOR) 40 MG tablet Take 40 mg by mouth at bedtime.    . SUMAtriptan (IMITREX) 50 MG tablet Take 1 tablet (50 mg total) by mouth every 2 (two) hours as needed for migraine. May repeat in 2 hours if headache persists or recurs.   No facility-administered encounter medications on file as of 10/22/2019.     ROS: Pertinent positives and negatives noted in HPI. Remainder of ROS non-contributory   Allergies  Allergen Reactions  . Celebrex [Celecoxib]     Hives, throat closes  . Tape     Hives, cannot tolerate surgical tape.  Hives, cannot tolerate surgical tape.   . Amoxicillin     REACTION: hives  . Cefuroxime Axetil Hives  . Clindamycin     REACTION: hives  . Codeine     Cannot remember, bad reaction  . Erythromycin Base     Intestinal distress  . Grass Extracts [Gramineae Pollens]     Lab tests positive  . Other     Lab positive Surgical tape -takes her skin off  . Tetracycline     REACTION: GI upset  . Tree Extract     Lab positive    BP 120/80 (BP Location: Left Arm, Patient Position: Sitting, Cuff Size: Normal)   Pulse 60   Temp (!) 97.4 F (36.3 C) (Temporal)   Ht 5\' 5"  (1.651 m)   Wt 148 lb 12.8 oz (67.5 kg)   SpO2 98%   BMI 24.76 kg/m   Physical Exam Constitutional:      General: She is not in acute distress.    Appearance: She is not toxic-appearing.  HENT:     Nose: No nasal tenderness.     Right Nostril: No epistaxis.     Left Nostril: No epistaxis.     Comments: Superomedial aspect of Rt nares with healing superficial lesion, no bleeding or tenderness  Neurological:     Mental Status: She is alert and oriented to person, place, and time.  Psychiatric:        Mood and Affect: Mood normal.        Behavior: Behavior normal.       A/P:  1. Internal nasal lesion - reviewed note from encounter with PCP in 10/11/19 - significantly improved with 5+ days of BID bactroban. Pain has resolved, lesion still present but also improved - recommended use of  bactroban BID x 5 more days then stop - if lesion  not healed in 10 days or so, pt to call office to discuss with PCP or myself  Discussed plan and reviewed medications with patient, including risks, benefits, and potential side effects. Pt expressed understand. All questions answered.  This visit occurred during the SARS-CoV-2 public health emergency.  Safety protocols were in place, including screening questions prior to the visit, additional usage of staff PPE, and extensive cleaning of exam room while observing appropriate contact time as indicated for disinfecting solutions.

## 2019-12-16 ENCOUNTER — Other Ambulatory Visit: Payer: Self-pay

## 2019-12-17 ENCOUNTER — Ambulatory Visit (INDEPENDENT_AMBULATORY_CARE_PROVIDER_SITE_OTHER): Payer: Medicare Other | Admitting: Family Medicine

## 2019-12-17 ENCOUNTER — Encounter: Payer: Self-pay | Admitting: Family Medicine

## 2019-12-17 ENCOUNTER — Telehealth: Payer: Self-pay | Admitting: Family Medicine

## 2019-12-17 VITALS — BP 115/64 | HR 64 | Temp 97.1°F | Ht 65.0 in | Wt 150.0 lb

## 2019-12-17 DIAGNOSIS — E785 Hyperlipidemia, unspecified: Secondary | ICD-10-CM

## 2019-12-17 DIAGNOSIS — I10 Essential (primary) hypertension: Secondary | ICD-10-CM

## 2019-12-17 DIAGNOSIS — E782 Mixed hyperlipidemia: Secondary | ICD-10-CM | POA: Diagnosis not present

## 2019-12-17 LAB — CBC
HCT: 38.6 % (ref 36.0–46.0)
Hemoglobin: 13 g/dL (ref 12.0–15.0)
MCHC: 33.7 g/dL (ref 30.0–36.0)
MCV: 91.4 fl (ref 78.0–100.0)
Platelets: 334 10*3/uL (ref 150.0–400.0)
RBC: 4.23 Mil/uL (ref 3.87–5.11)
RDW: 15.2 % (ref 11.5–15.5)
WBC: 6 10*3/uL (ref 4.0–10.5)

## 2019-12-17 LAB — URINALYSIS, ROUTINE W REFLEX MICROSCOPIC
Bilirubin Urine: NEGATIVE
Hgb urine dipstick: NEGATIVE
Ketones, ur: NEGATIVE
Leukocytes,Ua: NEGATIVE
Nitrite: NEGATIVE
Specific Gravity, Urine: 1.025 (ref 1.000–1.030)
Total Protein, Urine: NEGATIVE
Urine Glucose: NEGATIVE
Urobilinogen, UA: 0.2 (ref 0.0–1.0)
pH: 6 (ref 5.0–8.0)

## 2019-12-17 LAB — BASIC METABOLIC PANEL
BUN: 9 mg/dL (ref 6–23)
CO2: 27 mEq/L (ref 19–32)
Calcium: 9.8 mg/dL (ref 8.4–10.5)
Chloride: 101 mEq/L (ref 96–112)
Creatinine, Ser: 0.78 mg/dL (ref 0.40–1.20)
GFR: 70.37 mL/min (ref >60.00)
Glucose, Bld: 88 mg/dL (ref 70–99)
Potassium: 4.5 mEq/L (ref 3.5–5.1)
Sodium: 136 mEq/L (ref 135–145)

## 2019-12-17 LAB — LDL CHOLESTEROL, DIRECT: Direct LDL: 17 mg/dL

## 2019-12-17 NOTE — Progress Notes (Signed)
Established Patient Office Visit  Subjective:  Patient ID: Jennifer Boone, female    DOB: [birth year]  Age: 86 y.o. MRN: [id number]  CC:  Chief Complaint  Patient presents with  . Transitions Of Care    toc from Dr. Zigmund Daniel, concerns about swollen ankles.     HPI Jennifer Boone presents for follow-up of hypertension, mixed lipidemia and swelling about her ankles.  Swelling has been present in the posterior part of her ankles over the last few weeks.  She denies pain or injury.  Has not been up on her feet more than usual.  Denies increased salt load.  No history of renal or congestive heart failure.  Distant history of atrial fib that self corrected.  Blood pressure is well controlled with propanolol.  Has taken simvastatin for years for her cholesterol.  She sleeps on 2 pillows and this is not new for her.  Denies DOE shortness of breath or chest pain.  Denies palpitations.  Past Medical History:  Diagnosis Date  . Allergy   . Arrhythmia   . Arthritis   . GERD (gastroesophageal reflux disease)   . Hyperlipidemia   . Hypertension   . IBS (irritable bowel syndrome)   . Rheumatic fever    Childhood  . 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 ABDOMINAL HYSTERECTOMY    . APPENDECTOMY    . BOWEL RESECTION    . CATARACT EXTRACTION    . CHOLECYSTECTOMY    . HEMORROIDECTOMY    . ROTATOR CUFF REPAIR    . TONSILLECTOMY      Family History  Problem Relation Age of Onset  . Heart disease Mother   . Diabetes Sister   . Heart disease Sister     Social History   Socioeconomic History  . Marital status: Married    Spouse name: Not on file  . Number of children: Not on file  . Years of education: Not on file  . Highest education level: Not on file  Occupational History  . Not on file  Tobacco Use  . Smoking status: Former Smoker    Types: Cigarettes    Quit date: 04/29/1976    Years since quitting: 43.6  . Smokeless  tobacco: Never Used  Vaping Use  . Vaping Use: Never used  Substance and Sexual Activity  . Alcohol use: No  . Drug use: No  . Sexual activity: Never  Other Topics Concern  . Not on file  Social History Narrative  . Not on file   Social Determinants of Health   Financial Resource Strain:   . Difficulty of Paying Living Expenses: Not on file  Food Insecurity:   . Worried About Charity fundraiser in the Last Year: Not on file  . Ran Out of Food in the Last Year: Not on file  Transportation Needs:   . Lack of Transportation (Medical): Not on file  . Lack of Transportation (Non-Medical): Not on file  Physical Activity:   . Days of Exercise per Week: Not on file  . Minutes of Exercise per Session: Not on file  Stress:   . Feeling of Stress : Not on file  Social Connections:   . Frequency of Communication with Friends and Family: Not on file  . Frequency of Social Gatherings with Friends and Family: Not on file  . Attends Religious Services: Not on file  . Active Member of Clubs  or Organizations: Not on file  . Attends Archivist Meetings: Not on file  . Marital Status: Not on file  Intimate Partner Violence:   . Fear of Current or Ex-Partner: Not on file  . Emotionally Abused: Not on file  . Physically Abused: Not on file  . Sexually Abused: Not on file    Outpatient Medications Prior to Visit  Medication Sig Dispense Refill  . cholecalciferol (VITAMIN D) 400 units TABS tablet Take 400 Units by mouth.    . Coenzyme Q10 (COQ-10) 100 MG CAPS Take by mouth.    . FIBER COMPLETE PO Take by mouth.    . folic acid (FOLVITE) 1 MG tablet Take 3 mg by mouth daily.     Marland Kitchen glucosamine-chondroitin 500-400 MG tablet Take 1 tablet by mouth 3 (three) times daily.      . methotrexate (50 MG/ML) 1 g injection Inject into the vein once.    . propranolol (INDERAL) 10 MG tablet TAKE 1 TABLET(10 MG) BY MOUTH DAILY 90 tablet 2  . simvastatin (ZOCOR) 40 MG tablet Take 40 mg by mouth at  bedtime.      . SUMAtriptan (IMITREX) 50 MG tablet Take 1 tablet (50 mg total) by mouth every 2 (two) hours as needed for migraine. May repeat in 2 hours if headache persists or recurs. 10 tablet 2  . Multiple Vitamins-Minerals (PRESERVISION AREDS 2 PO) Take by mouth. (Patient not taking: Reported on 12/17/2019)    . mupirocin ointment (BACTROBAN) 2 % Place 1 application into the nose 2 (two) times daily. For 5 days. (Patient not taking: Reported on 12/17/2019) 15 g 0   No facility-administered medications prior to visit.    Allergies  Allergen Reactions  . Celebrex [Celecoxib]     Hives, throat closes  . Tape     Hives, cannot tolerate surgical tape.  Hives, cannot tolerate surgical tape.   . Amoxicillin     REACTION: hives  . Cefuroxime Axetil Hives  . Clindamycin     REACTION: hives  . Codeine     Cannot remember, bad reaction  . Erythromycin Base     Intestinal distress  . Grass Extracts [Gramineae Pollens]     Lab tests positive  . Other     Lab positive Surgical tape -takes her skin off  . Tetracycline     REACTION: GI upset  . Tree Extract     Lab positive    ROS Review of Systems  Constitutional: Negative.  Negative for diaphoresis.  HENT: Negative.   Respiratory: Negative.  Negative for chest tightness, shortness of breath and wheezing.   Cardiovascular: Negative for chest pain and palpitations.  Gastrointestinal: Negative for nausea and vomiting.  Genitourinary: Negative.  Negative for dysuria and frequency.  Musculoskeletal: Negative for arthralgias and gait problem.  Skin: Negative for pallor and rash.  Allergic/Immunologic: Negative for immunocompromised state.  Neurological: Negative for speech difficulty and light-headedness.  Hematological: Does not bruise/bleed easily.  Psychiatric/Behavioral: Negative.       Objective:    Physical Exam Nursing note reviewed.  Constitutional:      General: She is not in acute distress.    Appearance: She is  normal weight. She is not ill-appearing, toxic-appearing or diaphoretic.  HENT:     Head: Normocephalic and atraumatic.     Right Ear: External ear normal.     Left Ear: External ear normal.     Mouth/Throat:     Mouth: Mucous membranes are moist.  Pharynx: Oropharynx is clear. No oropharyngeal exudate or posterior oropharyngeal erythema.  Eyes:     General: No scleral icterus.       Right eye: No discharge.        Left eye: No discharge.     Conjunctiva/sclera: Conjunctivae normal.  Cardiovascular:     Rate and Rhythm: Normal rate and regular rhythm.  Pulmonary:     Effort: Pulmonary effort is normal. No respiratory distress.     Breath sounds: Normal breath sounds. No stridor. No wheezing, rhonchi or rales.  Abdominal:     General: Bowel sounds are normal.  Musculoskeletal:     Cervical back: No rigidity or tenderness.     Right lower leg: No edema.     Left lower leg: No edema.       Legs:  Lymphadenopathy:     Cervical: No cervical adenopathy.  Skin:    General: Skin is warm and dry.  Neurological:     Mental Status: She is alert and oriented to person, place, and time.  Psychiatric:        Mood and Affect: Mood normal.        Behavior: Behavior normal.     BP 115/64   Pulse 64   Temp (!) 97.1 F (36.2 C) (Tympanic)   Ht 5\' 5"  (1.651 m)   Wt 150 lb (68 kg)   SpO2 95%   BMI 24.96 kg/m  Wt Readings from Last 3 Encounters:  12/17/19 150 lb (68 kg)  10/22/19 148 lb 12.8 oz (67.5 kg)  10/11/19 146 lb 3.2 oz (66.3 kg)     Health Maintenance Due  Topic Date Due  . PNA vac Low Risk Adult (2 of 2 - PCV13) 05/09/2009  . INFLUENZA VACCINE  11/28/2019    There are no preventive care reminders to display for this patient.  Lab Results  Component Value Date   TSH 2.65 08/13/2017   Lab Results  Component Value Date   WBC 6.0 01/15/2018   HGB 13.2 01/15/2018   HCT 38.7 01/15/2018   MCV 88.9 01/15/2018   PLT 294.0 01/15/2018   Lab Results  Component  Value Date   NA 136 01/15/2018   K 4.8 01/15/2018   CO2 29 01/15/2018   GLUCOSE 107 (H) 01/15/2018   BUN 15 01/15/2018   CREATININE 0.75 01/15/2018   BILITOT 0.5 01/15/2018   ALKPHOS 67 01/15/2018   AST 20 01/15/2018   ALT 23 01/15/2018   PROT 6.7 01/15/2018   ALBUMIN 4.2 01/15/2018   CALCIUM 9.7 01/15/2018   GFR 78.62 01/15/2018   Lab Results  Component Value Date   CHOL 101 01/15/2018   Lab Results  Component Value Date   HDL 64.10 01/15/2018   Lab Results  Component Value Date   LDLCALC 23 01/15/2018   Lab Results  Component Value Date   TRIG 72.0 01/15/2018   Lab Results  Component Value Date   CHOLHDL 2 01/15/2018   Lab Results  Component Value Date   HGBA1C 5.4 01/27/2007      Assessment & Plan:   Problem List Items Addressed This Visit      Cardiovascular and Mediastinum   Essential hypertension   Relevant Orders   CBC   Basic metabolic panel     Other   Mixed hyperlipidemia   Relevant Orders   LDL cholesterol, direct    Other Visit Diagnoses    Hyperlipidemia, unspecified hyperlipidemia type    -  Primary  Relevant Orders   Urinalysis, Routine w reflex microscopic      No orders of the defined types were placed in this encounter.   Follow-up: Return in about 3 months (around 03/18/2020), or if symptoms worsen or fail to improve.   Continue current medications.  Recommend reassurance for her ankle swelling.  Follow-up as needed. Libby Maw, MD

## 2019-12-17 NOTE — Telephone Encounter (Signed)
FYI: Patient would like a copy of her labs results mailed to her once we have received them as well as a call to discuss them.

## 2019-12-21 NOTE — Telephone Encounter (Signed)
Patient aware of labs also mailed to patient per her request

## 2019-12-23 NOTE — Telephone Encounter (Signed)
Patient called back and stated that she had questions about labs and requesting a call back, please advise. CB is [phone]

## 2019-12-24 NOTE — Telephone Encounter (Signed)
Patient stopped into the office regarding her labs and she has a question about the medication that she was told to discontinue. Please call her back at [phone].

## 2019-12-28 ENCOUNTER — Other Ambulatory Visit: Payer: Self-pay | Admitting: Family Medicine

## 2019-12-28 NOTE — Telephone Encounter (Signed)
Spoke with patient who's calling states that she have been taking simvastatin for years wondering if it was safe to just stop taking. Explained to patient that she was put on this medication to bring levels down from being elevated now the mediation is doing it's job but to much for her patient aware that Direct LDL was lower than the normal and if she continue to take the medication it will continue to drop. Patient agrees to stop taking and states that she' is not sure when she will be leaving for [place] but if it is around the time for her to return in 2 months time she will have labs checked in [place].

## 2020-01-17 ENCOUNTER — Encounter: Payer: Self-pay | Admitting: Family Medicine

## 2020-01-17 ENCOUNTER — Other Ambulatory Visit: Payer: Self-pay

## 2020-01-17 ENCOUNTER — Ambulatory Visit (INDEPENDENT_AMBULATORY_CARE_PROVIDER_SITE_OTHER): Payer: Medicare Other | Admitting: Family Medicine

## 2020-01-17 VITALS — BP 124/70 | HR 70 | Temp 97.4°F | Ht 65.0 in | Wt 151.6 lb

## 2020-01-17 DIAGNOSIS — R7989 Other specified abnormal findings of blood chemistry: Secondary | ICD-10-CM

## 2020-01-17 DIAGNOSIS — H9113 Presbycusis, bilateral: Secondary | ICD-10-CM | POA: Diagnosis not present

## 2020-01-17 DIAGNOSIS — H9201 Otalgia, right ear: Secondary | ICD-10-CM

## 2020-01-17 NOTE — Addendum Note (Signed)
Addended by: Lynnea Ferrier on: 01/17/2020 04:35 PM   Modules accepted: Orders

## 2020-01-17 NOTE — Progress Notes (Signed)
Established Patient Office Visit  Subjective:  Patient ID: Jennifer Boone, female    DOB: [birth year]  Age: 86 y.o. MRN: [id number]  CC:  Chief Complaint  Patient presents with  . Acute Visit    RT ear pain off/on x 1 month, also questions on Simvastatin meds(stopping them)    HPI Jennifer Boone presents for follow-up of low LDL cholesterol.  Last check it was measured at 97.  Patient has been on higher dose simvastatin 40 mg daily for years.  She has never suffered a heart attack or stroke and has no vascular disease that she knows of at this time.  She quit smoking many years ago.  Blood pressures well controlled trolled with propranolol.  She does have a history of migraines.  She is experiencing right ear discomfort.  History of presbycusis.  She does have hearing amplification.  She is being refitted for a new device for the right ear.  She denies postnasal drip sneezing or cough   Past Medical History:  Diagnosis Date  . Allergy   . Arrhythmia   . Arthritis   . GERD (gastroesophageal reflux disease)   . Hyperlipidemia   . Hypertension   . IBS (irritable bowel syndrome)   . Rheumatic fever    Childhood  . 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 ABDOMINAL HYSTERECTOMY    . APPENDECTOMY    . BOWEL RESECTION    . CATARACT EXTRACTION    . CHOLECYSTECTOMY    . HEMORROIDECTOMY    . ROTATOR CUFF REPAIR    . TONSILLECTOMY      Family History  Problem Relation Age of Onset  . Heart disease Mother   . Diabetes Sister   . Heart disease Sister     Social History   Socioeconomic History  . Marital status: Married    Spouse name: Not on file  . Number of children: Not on file  . Years of education: Not on file  . Highest education level: Not on file  Occupational History  . Not on file  Tobacco Use  . Smoking status: Former Smoker    Types: Cigarettes    Quit date: 04/29/1976    Years since quitting: 43.7  .  Smokeless tobacco: Never Used  Vaping Use  . Vaping Use: Never used  Substance and Sexual Activity  . Alcohol use: No  . Drug use: No  . Sexual activity: Never  Other Topics Concern  . Not on file  Social History Narrative  . Not on file   Social Determinants of Health   Financial Resource Strain:   . Difficulty of Paying Living Expenses: Not on file  Food Insecurity:   . Worried About Charity fundraiser in the Last Year: Not on file  . Ran Out of Food in the Last Year: Not on file  Transportation Needs:   . Lack of Transportation (Medical): Not on file  . Lack of Transportation (Non-Medical): Not on file  Physical Activity:   . Days of Exercise per Week: Not on file  . Minutes of Exercise per Session: Not on file  Stress:   . Feeling of Stress : Not on file  Social Connections:   . Frequency of Communication with Friends and Family: Not on file  . Frequency of Social Gatherings with Friends and Family: Not on file  . Attends Religious Services: Not on file  .  Active Member of Clubs or Organizations: Not on file  . Attends Archivist Meetings: Not on file  . Marital Status: Not on file  Intimate Partner Violence:   . Fear of Current or Ex-Partner: Not on file  . Emotionally Abused: Not on file  . Physically Abused: Not on file  . Sexually Abused: Not on file    Outpatient Medications Prior to Visit  Medication Sig Dispense Refill  . Ascorbic Acid (VITAMIN C) 1000 MG tablet Take 1,000 mg by mouth daily.    . cholecalciferol (VITAMIN D) 400 units TABS tablet Take 400 Units by mouth.    . Coenzyme Q10 (COQ-10) 100 MG CAPS Take by mouth.    . FIBER COMPLETE PO Take by mouth.    . folic acid (FOLVITE) 1 MG tablet Take 3 mg by mouth daily.     Marland Kitchen glucosamine-chondroitin 500-400 MG tablet Take 1 tablet by mouth 3 (three) times daily.      . methotrexate (50 MG/ML) 1 g injection Inject into the vein once.    . Multiple Vitamins-Minerals (PRESERVISION AREDS 2 PO)  Take by mouth.     . mupirocin ointment (BACTROBAN) 2 % Place 1 application into the nose 2 (two) times daily. For 5 days. 15 g 0  . propranolol (INDERAL) 10 MG tablet TAKE 1 TABLET(10 MG) BY MOUTH DAILY 90 tablet 2  . simvastatin (ZOCOR) 40 MG tablet Take 40 mg by mouth at bedtime.      . SUMAtriptan (IMITREX) 50 MG tablet Take 1 tablet (50 mg total) by mouth every 2 (two) hours as needed for migraine. May repeat in 2 hours if headache persists or recurs. 10 tablet 2   No facility-administered medications prior to visit.    Allergies  Allergen Reactions  . Celebrex [Celecoxib]     Hives, throat closes  . Tape     Hives, cannot tolerate surgical tape.  Hives, cannot tolerate surgical tape.   . Amoxicillin     REACTION: hives  . Cefuroxime Axetil Hives  . Clindamycin     REACTION: hives  . Codeine     Cannot remember, bad reaction  . Erythromycin Base     Intestinal distress  . Grass Extracts [Gramineae Pollens]     Lab tests positive  . Other     Lab positive Surgical tape -takes her skin off  . Tetracycline     REACTION: GI upset  . Tree Extract     Lab positive    ROS Review of Systems  Constitutional: Negative for chills, diaphoresis, fatigue, fever and unexpected weight change.  HENT: Positive for ear pain and hearing loss. Negative for ear discharge, postnasal drip and sneezing.   Eyes: Negative for photophobia and visual disturbance.  Respiratory: Negative.   Cardiovascular: Negative.   Gastrointestinal: Negative.   Musculoskeletal: Negative for gait problem and joint swelling.  Allergic/Immunologic: Negative for immunocompromised state.  Neurological: Negative for light-headedness and numbness.  Hematological: Does not bruise/bleed easily.  Psychiatric/Behavioral: Negative.       Objective:    Physical Exam Vitals and nursing note reviewed.  Constitutional:      General: She is not in acute distress.    Appearance: Normal appearance. She is not  ill-appearing, toxic-appearing or diaphoretic.  HENT:     Head: Normocephalic and atraumatic.     Right Ear: Tympanic membrane, ear canal and external ear normal. There is no impacted cerumen.     Left Ear: Tympanic membrane, ear canal  and external ear normal. There is no impacted cerumen.  Eyes:     General: No scleral icterus.       Right eye: No discharge.        Left eye: No discharge.     Extraocular Movements: Extraocular movements intact.     Conjunctiva/sclera: Conjunctivae normal.     Pupils: Pupils are equal, round, and reactive to light.  Cardiovascular:     Rate and Rhythm: Normal rate and regular rhythm.  Pulmonary:     Effort: Pulmonary effort is normal.     Breath sounds: Normal breath sounds.  Musculoskeletal:     Cervical back: No rigidity or tenderness.     Right lower leg: No edema.     Left lower leg: No edema.  Lymphadenopathy:     Cervical: No cervical adenopathy.  Skin:    General: Skin is warm and dry.  Neurological:     Mental Status: She is alert and oriented to person, place, and time.  Psychiatric:        Mood and Affect: Mood normal.        Behavior: Behavior normal.     BP 124/70   Pulse 70   Temp (!) 97.4 F (36.3 C) (Temporal)   Ht 5\' 5"  (1.651 m)   Wt 151 lb 9.6 oz (68.8 kg)   SpO2 97%   BMI 25.23 kg/m  Wt Readings from Last 3 Encounters:  01/17/20 151 lb 9.6 oz (68.8 kg)  12/17/19 150 lb (68 kg)  10/22/19 148 lb 12.8 oz (67.5 kg)     Health Maintenance Due  Topic Date Due  . PNA vac Low Risk Adult (2 of 2 - PCV13) 05/09/2009  . INFLUENZA VACCINE  11/28/2019    There are no preventive care reminders to display for this patient.  Lab Results  Component Value Date   TSH 2.65 08/13/2017   Lab Results  Component Value Date   WBC 6.0 12/17/2019   HGB 13.0 12/17/2019   HCT 38.6 12/17/2019   MCV 91.4 12/17/2019   PLT 334.0 12/17/2019   Lab Results  Component Value Date   NA 136 12/17/2019   K 4.5 12/17/2019   CO2 27  12/17/2019   GLUCOSE 88 12/17/2019   BUN 9 12/17/2019   CREATININE 0.78 12/17/2019   BILITOT 0.5 01/15/2018   ALKPHOS 67 01/15/2018   AST 20 01/15/2018   ALT 23 01/15/2018   PROT 6.7 01/15/2018   ALBUMIN 4.2 01/15/2018   CALCIUM 9.8 12/17/2019   GFR 70.37 12/17/2019   Lab Results  Component Value Date   CHOL 101 01/15/2018   Lab Results  Component Value Date   HDL 64.10 01/15/2018   Lab Results  Component Value Date   LDLCALC 23 01/15/2018   Lab Results  Component Value Date   TRIG 72.0 01/15/2018   Lab Results  Component Value Date   CHOLHDL 2 01/15/2018   Lab Results  Component Value Date   HGBA1C 5.4 01/27/2007      Assessment & Plan:   Problem List Items Addressed This Visit      Nervous and Auditory   Presbycusis of both ears     Other   Low serum low density lipoprotein (LDL)   Relevant Orders   Lipid panel   Right ear pain - Primary      No orders of the defined types were placed in this encounter.   Follow-up: Return in about 6 months (around 07/16/2020), or  Return fasting for repeat lipid profile in the next few days..  Will probably restart simvastatin at a lower dose.  Libby Maw, MD

## 2020-01-19 ENCOUNTER — Other Ambulatory Visit: Payer: Self-pay

## 2020-01-20 ENCOUNTER — Other Ambulatory Visit (INDEPENDENT_AMBULATORY_CARE_PROVIDER_SITE_OTHER): Payer: Medicare Other

## 2020-01-20 DIAGNOSIS — R7989 Other specified abnormal findings of blood chemistry: Secondary | ICD-10-CM | POA: Diagnosis not present

## 2020-01-20 LAB — LIPID PANEL
Cholesterol: 174 mg/dL (ref 0–200)
HDL: 83.8 mg/dL (ref >39.00)
LDL Cholesterol: 76 mg/dL (ref 0–99)
NonHDL: 89.84
Total CHOL/HDL Ratio: 2
Triglycerides: 71 mg/dL (ref 0.0–149.0)
VLDL: 14.2 mg/dL (ref 0.0–40.0)

## 2020-02-01 ENCOUNTER — Ambulatory Visit (INDEPENDENT_AMBULATORY_CARE_PROVIDER_SITE_OTHER): Payer: Medicare Other | Admitting: Nurse Practitioner

## 2020-02-01 ENCOUNTER — Encounter: Payer: Self-pay | Admitting: Nurse Practitioner

## 2020-02-01 VITALS — Ht 65.0 in | Wt 148.0 lb

## 2020-02-01 DIAGNOSIS — M62830 Muscle spasm of back: Secondary | ICD-10-CM | POA: Diagnosis not present

## 2020-02-01 MED ORDER — ACETAMINOPHEN ER 650 MG PO TBCR: 650.0000 mg | EXTENDED_RELEASE_TABLET | Freq: Three times a day (TID) | ORAL | Status: AC | PRN

## 2020-02-01 MED ORDER — TIZANIDINE HCL 2 MG PO TABS: 2.0000 mg | ORAL_TABLET | Freq: Every day | ORAL | 0 refills | Status: DC

## 2020-02-01 NOTE — Progress Notes (Signed)
Virtual Visit via Telephone Note  I connected with Jennifer Boone on 02/01/20 at  9:00 AM EDT by telephone and verified that I am speaking with the correct person using two identifiers.  Location: Patient: home Provider:office Participants: patient and provider  I discussed the limitations, risks, security and privacy concerns of performing an evaluation and management service by telephone and the availability of in person appointments. I also discussed with the patient that there may be a patient responsible charge related to this service. The patient expressed understanding and agreed to proceed.  CC: Pt c/o back pain and diarrhea x2 days. Pt states she has not had diarrhea since yesterday. Pt had heating pad on her back yesterday which did provide some relief. Back pain is right below the waist and on her left side.   History of Present Illness: Ms. Jennifer Boone reports diarrhea (6x) and ABD pain yesterday, but now resolved, no nausea or fever/chills or blood in stool/melena or vomiting. Last meal was yesterday evening, no symptoms after meal. No OTC medication used. 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  No hx ov acute diverticulitis. Back Pain This is a chronic problem. The current episode started yesterday. The problem occurs intermittently. The problem is unchanged. The pain is present in the lumbar spine. The quality of the pain is described as cramping and aching. The pain does not radiate. The pain is worse during the day. The symptoms are aggravated by bending, standing and twisting. Pertinent negatives include no abdominal pain, bladder incontinence, bowel incontinence, dysuria, fever, leg pain, numbness, paresis, paresthesias, pelvic pain, perianal numbness, tingling, weakness or weight loss. Treatments tried: voltaren gel. The treatment provided mild relief.  unable to tolerate oral NSAIDs Does not recall any specific activity that may have trigger back pain.    Observations/Objective: Telephone call, unable to eval  Assessment and Plan: Jennifer Boone was seen today for acute visit.  Diagnoses and all orders for this visit:  Muscle spasm of back -     tiZANidine (ZANAFLEX) 2 MG tablet; Take 1 tablet (2 mg total) by mouth at bedtime. -     acetaminophen (TYLENOL 8 HOUR) 650 MG CR tablet; Take 1 tablet (650 mg total) by mouth every 8 (eight) hours as needed for pain.   Follow Up Instructions: Call office if no improvement in 24hrs Alternate between warm and cold compress prn Use medications above as prescribed Adverse about possible side effects of zanaflex (dizziness, dry mouth, blurry vision). She is stop medication and call office if develops any of the side effects.   I discussed the assessment and treatment plan with the patient. The patient was provided an opportunity to ask questions and all were answered. The patient agreed with the plan and demonstrated an understanding of the instructions.   The patient was advised to call back or seek an in-person evaluation if the symptoms worsen or if the condition fails to improve as anticipated.  I provided 10 minutes of non-face-to-face time during this encounter.  Wilfred Lacy, NP

## 2020-02-01 NOTE — Patient Instructions (Signed)
Call office if no improvement in 24hrs Alternate between warm and cold compress prn Use medications above as prescribed Adverse about possible side effects of zanaflex (dizziness, dry mouth, blurry vision). She is stop medication and call office if develops any of the side effects.

## 2020-02-05 ENCOUNTER — Other Ambulatory Visit: Payer: Self-pay | Admitting: Nurse Practitioner

## 2020-02-05 DIAGNOSIS — M62830 Muscle spasm of back: Secondary | ICD-10-CM

## 2020-02-07 NOTE — Telephone Encounter (Signed)
Inquire if her symptoms are still present? Did she request for zanaflex refill?

## 2020-02-08 ENCOUNTER — Other Ambulatory Visit: Payer: Self-pay | Admitting: Nurse Practitioner

## 2020-02-08 DIAGNOSIS — M62830 Muscle spasm of back: Secondary | ICD-10-CM

## 2020-02-08 NOTE — Telephone Encounter (Signed)
Pt states she still has symptoms but she did not request the Rx that came from the pharmacy. Pt states she does think she could use a refill.

## 2020-02-16 ENCOUNTER — Telehealth: Payer: Self-pay | Admitting: Family Medicine

## 2020-02-16 NOTE — Telephone Encounter (Signed)
Spoke with patient and she will be in [facility] for the next 6 months and declined

## 2020-02-16 NOTE — Telephone Encounter (Signed)
Left message for patient to schedule Annual Wellness Visit.  Please schedule with Nurse Health Advisor Martha Stanley, RN at [facility]  °

## 2020-09-21 ENCOUNTER — Other Ambulatory Visit: Payer: Self-pay | Admitting: Family Medicine

## 2020-12-18 ENCOUNTER — Other Ambulatory Visit: Payer: Self-pay | Admitting: Family Medicine

## 2021-01-05 ENCOUNTER — Telehealth: Payer: Self-pay

## 2021-01-05 NOTE — Telephone Encounter (Signed)
Called patient to try and schedule an AWV and she has moved to [place] so she declines.  Dm/cma

## 2021-03-17 ENCOUNTER — Other Ambulatory Visit: Payer: Self-pay | Admitting: Family Medicine

## 2021-04-19 ENCOUNTER — Telehealth: Payer: Self-pay | Admitting: Family Medicine

## 2021-04-19 NOTE — Telephone Encounter (Signed)
Left message for patient to call back and schedule Medicare Annual Wellness Visit (AWV) in office.   If not able to come in office, please offer to do virtually or by telephone.  Left office number and my jabber [phone].  Last AWV:02/27/2018  Please schedule at anytime with Nurse Health Advisor.
